# Patient Record
Sex: Female | Born: 1970 | Race: White | Hispanic: No | Marital: Married | Smoking: Never smoker
Health system: Southern US, Community
[De-identification: ages and names within clinical notes are randomized; demographics above are authoritative.]

## PROBLEM LIST (undated history)

## (undated) DIAGNOSIS — G40909 Epilepsy, unspecified, not intractable, without status epilepticus: Secondary | ICD-10-CM

## (undated) DIAGNOSIS — D649 Anemia, unspecified: Secondary | ICD-10-CM

## (undated) DIAGNOSIS — G40209 Localization-related (focal) (partial) symptomatic epilepsy and epileptic syndromes with complex partial seizures, not intractable, without status epilepticus: Secondary | ICD-10-CM

## (undated) DIAGNOSIS — E871 Hypo-osmolality and hyponatremia: Secondary | ICD-10-CM

## (undated) DIAGNOSIS — J329 Chronic sinusitis, unspecified: Secondary | ICD-10-CM

## (undated) DIAGNOSIS — R87612 Low grade squamous intraepithelial lesion on cytologic smear of cervix (LGSIL): Secondary | ICD-10-CM

## (undated) DIAGNOSIS — K219 Gastro-esophageal reflux disease without esophagitis: Secondary | ICD-10-CM

## (undated) DIAGNOSIS — G43909 Migraine, unspecified, not intractable, without status migrainosus: Secondary | ICD-10-CM

## (undated) DIAGNOSIS — T7840XA Allergy, unspecified, initial encounter: Secondary | ICD-10-CM

## (undated) DIAGNOSIS — T8859XA Other complications of anesthesia, initial encounter: Secondary | ICD-10-CM

## (undated) HISTORY — DX: Epilepsy, unspecified, not intractable, without status epilepticus: G40.909

## (undated) HISTORY — DX: Low grade squamous intraepithelial lesion on cytologic smear of cervix (LGSIL): R87.612

## (undated) HISTORY — PX: NASAL SINUS SURGERY: SHX719

## (undated) HISTORY — PX: TUBAL LIGATION: SHX77

## (undated) HISTORY — DX: Chronic sinusitis, unspecified: J32.9

## (undated) HISTORY — DX: Migraine, unspecified, not intractable, without status migrainosus: G43.909

## (undated) HISTORY — PX: WISDOM TOOTH EXTRACTION: SHX21

---

## 1998-01-22 ENCOUNTER — Other Ambulatory Visit: Admission: RE | Admit: 1998-01-22 | Discharge: 1998-01-22 | Payer: Self-pay | Admitting: Gynecology

## 2000-01-14 ENCOUNTER — Other Ambulatory Visit: Admission: RE | Admit: 2000-01-14 | Discharge: 2000-01-14 | Payer: Self-pay | Admitting: Gynecology

## 2000-05-17 ENCOUNTER — Encounter: Admission: RE | Admit: 2000-05-17 | Discharge: 2000-08-15 | Payer: Self-pay | Admitting: Gynecology

## 2000-06-25 ENCOUNTER — Inpatient Hospital Stay (HOSPITAL_COMMUNITY): Admission: AD | Admit: 2000-06-25 | Discharge: 2000-06-25 | Payer: Self-pay | Admitting: Gynecology

## 2000-06-25 ENCOUNTER — Encounter: Payer: Self-pay | Admitting: Gynecology

## 2000-06-27 ENCOUNTER — Inpatient Hospital Stay (HOSPITAL_COMMUNITY): Admission: AD | Admit: 2000-06-27 | Discharge: 2000-06-27 | Payer: Self-pay | Admitting: Gynecology

## 2000-07-24 ENCOUNTER — Inpatient Hospital Stay (HOSPITAL_COMMUNITY): Admission: AD | Admit: 2000-07-24 | Discharge: 2000-07-27 | Payer: Self-pay | Admitting: Gynecology

## 2000-07-24 ENCOUNTER — Encounter (INDEPENDENT_AMBULATORY_CARE_PROVIDER_SITE_OTHER): Payer: Self-pay

## 2000-08-29 ENCOUNTER — Other Ambulatory Visit: Admission: RE | Admit: 2000-08-29 | Discharge: 2000-08-29 | Payer: Self-pay | Admitting: Gynecology

## 2001-09-18 ENCOUNTER — Other Ambulatory Visit: Admission: RE | Admit: 2001-09-18 | Discharge: 2001-09-18 | Payer: Self-pay | Admitting: Gynecology

## 2002-10-22 ENCOUNTER — Other Ambulatory Visit: Admission: RE | Admit: 2002-10-22 | Discharge: 2002-10-22 | Payer: Self-pay | Admitting: Gynecology

## 2003-10-28 ENCOUNTER — Other Ambulatory Visit: Admission: RE | Admit: 2003-10-28 | Discharge: 2003-10-28 | Payer: Self-pay | Admitting: Gynecology

## 2004-11-03 ENCOUNTER — Other Ambulatory Visit: Admission: RE | Admit: 2004-11-03 | Discharge: 2004-11-03 | Payer: Self-pay | Admitting: Gynecology

## 2005-11-27 ENCOUNTER — Emergency Department (HOSPITAL_COMMUNITY): Admission: EM | Admit: 2005-11-27 | Discharge: 2005-11-27 | Payer: Self-pay | Admitting: Emergency Medicine

## 2005-12-16 ENCOUNTER — Ambulatory Visit (HOSPITAL_COMMUNITY): Admission: RE | Admit: 2005-12-16 | Discharge: 2005-12-16 | Payer: Self-pay | Admitting: Gynecology

## 2006-01-05 ENCOUNTER — Other Ambulatory Visit: Admission: RE | Admit: 2006-01-05 | Discharge: 2006-01-05 | Payer: Self-pay | Admitting: Gynecology

## 2007-01-23 ENCOUNTER — Other Ambulatory Visit: Admission: RE | Admit: 2007-01-23 | Discharge: 2007-01-23 | Payer: Self-pay | Admitting: Gynecology

## 2007-03-25 ENCOUNTER — Emergency Department: Payer: Self-pay | Admitting: Emergency Medicine

## 2008-12-05 ENCOUNTER — Other Ambulatory Visit: Admission: RE | Admit: 2008-12-05 | Discharge: 2008-12-05 | Payer: Self-pay | Admitting: Gynecology

## 2008-12-05 ENCOUNTER — Ambulatory Visit: Payer: Self-pay | Admitting: Gynecology

## 2008-12-05 ENCOUNTER — Encounter: Payer: Self-pay | Admitting: Gynecology

## 2009-12-11 ENCOUNTER — Ambulatory Visit (HOSPITAL_COMMUNITY): Admission: RE | Admit: 2009-12-11 | Discharge: 2009-12-11 | Payer: Self-pay | Admitting: Gynecology

## 2009-12-16 ENCOUNTER — Other Ambulatory Visit: Admission: RE | Admit: 2009-12-16 | Discharge: 2009-12-16 | Payer: Self-pay | Admitting: Gynecology

## 2009-12-16 ENCOUNTER — Ambulatory Visit: Payer: Self-pay | Admitting: Gynecology

## 2010-03-23 ENCOUNTER — Ambulatory Visit: Payer: Self-pay | Admitting: Unknown Physician Specialty

## 2010-04-09 ENCOUNTER — Ambulatory Visit: Payer: Self-pay | Admitting: Unknown Physician Specialty

## 2010-11-05 NOTE — Op Note (Signed)
[facility]  Patient:    Patricia Cooper, Patricia Cooper Visit Number: [id number] MRN: [id number]          Service Type: OBS Location: 910A [id number] [id number] Attending Physician:  Douglass Rivers Proc. Date: 07/26/00 Admit Date:  07/24/2000 Discharge Date: 07/27/2000                             Operative Report  PREOPERATIVE DIAGNOSIS:       Desires permanent sterilization.  POSTOPERATIVE DIAGNOSIS:      Desires permanent sterilization.  PROCEDURE:                    Postpartum bilateral tubal sterilization.  SURGEON:                      Timothy P. Fontaine, M.D.  ANESTHESIA:                   Epidural, local skin.  SPECIMENS:                    Portions of right and left fallopian tube.  ESTIMATED BLOOD LOSS:         Minimal.  COMPLICATIONS:                None.  FINDINGS:                     The right and left fallopian tubes normal in caliber with fimbriated ends.  The right and left ovaries were grossly normal to limited inspection.  DESCRIPTION OF PROCEDURE:     The patient was taken to the operating room, had her epidural catheter dosed, received a periumbilical preparation with Betadine solution and was draped in the usual fashion.  Due to persistent skin sensitivity, the patient received 0.25% Marcaine skin infiltration in the periumbilical site.  Subsequently, the abdomen was sharply entered through a small infraumbilical transverse incision.  The left fallopian tube was then identified, delivered through the incision, traced from its insertion to its fimbriated end and a mid tubal segment was then doubly ligated using 0 plain suture and sharply excised.  The tubal lumen as well as adequate hemostasis was grossly visualized afterwards.  A similar procedure was then carried out on the other side.  The anterior fascia was then reapproximated with 0 Vicryl suture in a running stitch.  The subcutaneous tissues were reapproximated with several interrupted 0  Vicryl sutures and the skin was reapproximated using 4-0 Vicryl suture on a running subcuticular stitch.  A sterile dressing was applied.  The patient was taken to the recovery room in good condition, having tolerated the procedure well. Attending Physician:  Douglass Rivers DD:  07/26/00 TD:  07/27/00 Job: [id number]

## 2010-11-05 NOTE — Discharge Summary (Signed)
[facility]  Patient:    Patricia Cooper, Patricia Cooper                        MRN: [id number] Adm. Date:  [id number] Disc. Date: [id number] Attending:  Douglass Rivers Dictator:   Antony Contras, N.P.                           Discharge Summary  DISCHARGE DIAGNOSES:          Intrauterine pregnancy at term, history of seizure disorder.  PROCEDURE:                    Mityvac assisted vaginal delivery over an intact perineum with repair of a second degree midline laceration and small supraurethral lacerations, postpartum tubal sterilization.  HISTORY OF PRESENT ILLNESS:   Patient is a 40 year old gravida 2, para 1-0-0-1 with LMP of May 2001, [facility] August 03, 2000.  Prenatal risk factors include history of seizure disorder.  Patient is on Tegretol 500 mg b.i.d.  PRENATAL LABORATORIES:        Blood type A+.  Antibody screen negative.  RPR, HBSAG, HIV, toxo negative.  Rubella immune.  GBS negative.  HOSPITAL COURSE:              Patient was admitted on July 25, 2000 with spontaneous onset of labor.  Cervix was 2-3 cm, posterior.  She did progress to complete dilatation.  Delivery was accomplished per vacuum assistance.  She was delivered of an Apgar 8/9 female infant weighing 7 pounds 8 ounces over an intact perineum.  She did sustain second degree midline laceration and some small supraurethral lacerations which were repaired.  Placenta was intact. She wished sterilization and therefore postpartum tubal sterilization, modified Pomeroy technique, was performed by Dr. Audie Box on July 26, 2000 under epidural anesthesia.  Postpartum and postoperative courses were uncomplicated.  She remained afebrile.  She was able to be discharged on her second postpartum/first postoperative day in satisfactory condition.  LABORATORIES:                 CBC:  Hematocrit 30.4, hemoglobin 10.6, WBC 11.8, platelets 185,000.  DISPOSITION:                  Follow-up in six weeks.   Continue with Motrin for pain.  Continue prenatal vitamins and iron. DD:  08/28/00 TD:  08/29/00 Job: [id number]

## 2010-12-03 ENCOUNTER — Other Ambulatory Visit: Payer: Self-pay | Admitting: Gynecology

## 2010-12-03 DIAGNOSIS — Z1231 Encounter for screening mammogram for malignant neoplasm of breast: Secondary | ICD-10-CM

## 2010-12-15 ENCOUNTER — Ambulatory Visit (HOSPITAL_COMMUNITY)
Admission: RE | Admit: 2010-12-15 | Discharge: 2010-12-15 | Disposition: A | Discharge status: Home / Self Care | Payer: BC Managed Care – PPO | Source: Ambulatory Visit | Attending: Gynecology | Admitting: Gynecology

## 2010-12-15 DIAGNOSIS — Z1231 Encounter for screening mammogram for malignant neoplasm of breast: Secondary | ICD-10-CM

## 2010-12-16 ENCOUNTER — Other Ambulatory Visit: Payer: Self-pay | Admitting: Gynecology

## 2010-12-16 DIAGNOSIS — R928 Other abnormal and inconclusive findings on diagnostic imaging of breast: Secondary | ICD-10-CM

## 2010-12-20 ENCOUNTER — Ambulatory Visit
Admission: RE | Admit: 2010-12-20 | Discharge: 2010-12-20 | Disposition: A | Discharge status: Home / Self Care | Payer: BC Managed Care – PPO | Source: Ambulatory Visit | Attending: Gynecology | Admitting: Gynecology

## 2010-12-20 DIAGNOSIS — R928 Other abnormal and inconclusive findings on diagnostic imaging of breast: Secondary | ICD-10-CM

## 2010-12-28 ENCOUNTER — Encounter (INDEPENDENT_AMBULATORY_CARE_PROVIDER_SITE_OTHER): Payer: BC Managed Care – PPO | Admitting: Gynecology

## 2010-12-28 ENCOUNTER — Other Ambulatory Visit (HOSPITAL_COMMUNITY)
Admission: RE | Admit: 2010-12-28 | Discharge: 2010-12-28 | Disposition: A | Discharge status: Home / Self Care | Payer: BC Managed Care – PPO | Source: Ambulatory Visit | Attending: Gynecology | Admitting: Gynecology

## 2010-12-28 ENCOUNTER — Other Ambulatory Visit: Payer: Self-pay | Admitting: Gynecology

## 2010-12-28 DIAGNOSIS — R823 Hemoglobinuria: Secondary | ICD-10-CM

## 2010-12-28 DIAGNOSIS — Z13220 Encounter for screening for lipoid disorders: Secondary | ICD-10-CM

## 2010-12-28 DIAGNOSIS — Z01419 Encounter for gynecological examination (general) (routine) without abnormal findings: Secondary | ICD-10-CM

## 2010-12-28 DIAGNOSIS — Z124 Encounter for screening for malignant neoplasm of cervix: Secondary | ICD-10-CM | POA: Insufficient documentation

## 2010-12-28 DIAGNOSIS — Z833 Family history of diabetes mellitus: Secondary | ICD-10-CM

## 2011-12-14 ENCOUNTER — Other Ambulatory Visit: Payer: Self-pay | Admitting: Gynecology

## 2011-12-14 DIAGNOSIS — Z1231 Encounter for screening mammogram for malignant neoplasm of breast: Secondary | ICD-10-CM

## 2012-01-06 ENCOUNTER — Ambulatory Visit (HOSPITAL_COMMUNITY)
Admission: RE | Admit: 2012-01-06 | Discharge: 2012-01-06 | Disposition: A | Discharge status: Home / Self Care | Payer: BC Managed Care – PPO | Source: Ambulatory Visit | Attending: Gynecology | Admitting: Gynecology

## 2012-01-06 DIAGNOSIS — Z1231 Encounter for screening mammogram for malignant neoplasm of breast: Secondary | ICD-10-CM | POA: Insufficient documentation

## 2012-04-06 ENCOUNTER — Encounter: Payer: BC Managed Care – PPO | Admitting: Gynecology

## 2012-10-08 ENCOUNTER — Encounter: Payer: Self-pay | Admitting: Gynecology

## 2012-10-08 ENCOUNTER — Ambulatory Visit (INDEPENDENT_AMBULATORY_CARE_PROVIDER_SITE_OTHER): Payer: BC Managed Care – PPO | Admitting: Gynecology

## 2012-10-08 VITALS — BP 114/70 | Ht 65.0 in | Wt 146.0 lb

## 2012-10-08 DIAGNOSIS — Z13220 Encounter for screening for lipoid disorders: Secondary | ICD-10-CM

## 2012-10-08 DIAGNOSIS — Z01419 Encounter for gynecological examination (general) (routine) without abnormal findings: Secondary | ICD-10-CM

## 2012-10-08 LAB — CBC WITH DIFFERENTIAL/PLATELET
Basophils Absolute: 0 10*3/uL (ref 0.0–0.1)
Basophils Relative: 1 % (ref 0–1)
Eosinophils Relative: 6 % — ABNORMAL HIGH (ref 0–5)
HCT: 36.6 % (ref 36.0–46.0)
Hemoglobin: 12.2 g/dL (ref 12.0–15.0)
MCH: 30.3 pg (ref 26.0–34.0)
MCHC: 33.3 g/dL (ref 30.0–36.0)
MCV: 91 fL (ref 78.0–100.0)
Monocytes Absolute: 0.5 10*3/uL (ref 0.1–1.0)
Monocytes Relative: 13 % — ABNORMAL HIGH (ref 3–12)
RDW: 13.6 % (ref 11.5–15.5)

## 2012-10-08 LAB — COMPREHENSIVE METABOLIC PANEL
AST: 18 U/L (ref 0–37)
Albumin: 4.3 g/dL (ref 3.5–5.2)
Alkaline Phosphatase: 58 U/L (ref 39–117)
BUN: 8 mg/dL (ref 6–23)
Calcium: 9.2 mg/dL (ref 8.4–10.5)
Creat: 0.6 mg/dL (ref 0.50–1.10)
Glucose, Bld: 87 mg/dL (ref 70–99)
Potassium: 4.1 mEq/L (ref 3.5–5.3)

## 2012-10-08 LAB — LIPID PANEL
HDL: 76 mg/dL (ref >39)
LDL Cholesterol: 137 mg/dL — ABNORMAL HIGH (ref 0–99)
Total CHOL/HDL Ratio: 3 Ratio
Triglycerides: 60 mg/dL (ref <150)

## 2012-10-08 NOTE — Progress Notes (Signed)
Patricia Cooper February 19, 1971 [id number]        42 y.o.  G2P2002 for annual exam.  Doing well without complaints.  Past medical history,surgical history, medications, allergies, family history and social history were all reviewed and documented in the EPIC chart. ROS:  Was performed and pertinent positives and negatives are included in the history.  Exam: Kim assistant Filed Vitals:   10/08/12 1007  BP: 114/70  Height: 5\' 5"  (1.651 m)  Weight: 146 lb (66.225 kg)   General appearance  Normal Skin grossly normal Head/Neck normal with no cervical or supraclavicular adenopathy thyroid normal Lungs  clear Cardiac RR, without RMG Abdominal  soft, nontender, without masses, organomegaly or hernia Breasts  examined lying and sitting without masses, retractions, discharge or axillary adenopathy. Pelvic  Ext/BUS/vagina  normal   Cervix  normal   Uterus  anteverted, normal size, shape and contour, midline and mobile nontender   Adnexa  Without masses or tenderness    Anus and perineum  normal   Rectovaginal  normal sphincter tone without palpated masses or tenderness.    Assessment/Plan:  42 y.o. G59P2002 female for annual exam, regular menses, tubal sterilization.   1. Pap smear 12/2010. No Pap smear done today. No history of abnormal Pap smears. Plan repeat next year at 3 year interval. 2. Mammography 12/2011. Continue with annual mammography. SBE monthly reviewed. 3. Health maintenance. Baseline CBC comprehensive metabolic panel lipid profile urinalysis ordered. Continue to followup with her other physicians in reference to her asthma/epilepsy. Followup one year for annual pathologic exam.    Dara Lords MD, 10:34 AM 10/08/2012

## 2012-10-08 NOTE — Patient Instructions (Signed)
Follow up in one year for annual gynecologic exam. 

## 2012-10-09 ENCOUNTER — Other Ambulatory Visit: Payer: Self-pay | Admitting: Gynecology

## 2012-10-09 DIAGNOSIS — D72819 Decreased white blood cell count, unspecified: Secondary | ICD-10-CM

## 2012-10-09 DIAGNOSIS — E871 Hypo-osmolality and hyponatremia: Secondary | ICD-10-CM

## 2012-10-09 DIAGNOSIS — E7800 Pure hypercholesterolemia, unspecified: Secondary | ICD-10-CM

## 2012-10-09 DIAGNOSIS — E878 Other disorders of electrolyte and fluid balance, not elsewhere classified: Secondary | ICD-10-CM

## 2012-10-09 LAB — URINALYSIS W MICROSCOPIC + REFLEX CULTURE
Bacteria, UA: NONE SEEN
Casts: NONE SEEN
Glucose, UA: NEGATIVE mg/dL
Hgb urine dipstick: NEGATIVE
Ketones, ur: NEGATIVE mg/dL
Leukocytes, UA: NEGATIVE
Protein, ur: NEGATIVE mg/dL
pH: 6.5 (ref 5.0–8.0)

## 2012-10-10 ENCOUNTER — Other Ambulatory Visit: Payer: BC Managed Care – PPO

## 2012-10-10 DIAGNOSIS — E871 Hypo-osmolality and hyponatremia: Secondary | ICD-10-CM

## 2012-10-10 DIAGNOSIS — E878 Other disorders of electrolyte and fluid balance, not elsewhere classified: Secondary | ICD-10-CM

## 2012-10-10 DIAGNOSIS — D72819 Decreased white blood cell count, unspecified: Secondary | ICD-10-CM

## 2012-10-10 DIAGNOSIS — E7800 Pure hypercholesterolemia, unspecified: Secondary | ICD-10-CM

## 2012-10-10 LAB — ELECTROLYTE PANEL
Chloride: 98 mEq/L (ref 96–112)
Potassium: 4.2 mEq/L (ref 3.5–5.3)

## 2012-10-10 LAB — CBC WITH DIFFERENTIAL/PLATELET
Basophils Absolute: 0 K/uL (ref 0.0–0.1)
Basophils Relative: 1 % (ref 0–1)
Eosinophils Absolute: 0.2 K/uL (ref 0.0–0.7)
Eosinophils Relative: 6 % — ABNORMAL HIGH (ref 0–5)
HCT: 35.9 % — ABNORMAL LOW (ref 36.0–46.0)
Hemoglobin: 12.1 g/dL (ref 12.0–15.0)
Lymphocytes Relative: 36 % (ref 12–46)
Lymphs Abs: 1.2 K/uL (ref 0.7–4.0)
MCH: 30.3 pg (ref 26.0–34.0)
MCHC: 33.7 g/dL (ref 30.0–36.0)
MCV: 90 fL (ref 78.0–100.0)
Monocytes Absolute: 0.4 K/uL (ref 0.1–1.0)
Monocytes Relative: 11 % (ref 3–12)
Neutro Abs: 1.5 K/uL — ABNORMAL LOW (ref 1.7–7.7)
Neutrophils Relative %: 46 % (ref 43–77)
Platelets: 315 K/uL (ref 150–400)
RBC: 3.99 MIL/uL (ref 3.87–5.11)
RDW: 13.5 % (ref 11.5–15.5)
WBC: 3.3 K/uL — ABNORMAL LOW (ref 4.0–10.5)

## 2012-10-10 LAB — LIPID PANEL: LDL Cholesterol: 137 mg/dL — ABNORMAL HIGH (ref 0–99)

## 2012-10-16 ENCOUNTER — Telehealth: Payer: Self-pay

## 2012-10-16 NOTE — Telephone Encounter (Signed)
Patient called to say she took Provera and no menses within a week of last pill.  I told her Dr. Glenetta Hew had recommended office visit if no menses.  She was transferred to appt desk to schedule appt.

## 2013-04-25 ENCOUNTER — Other Ambulatory Visit: Payer: Self-pay

## 2013-12-13 ENCOUNTER — Other Ambulatory Visit: Payer: Self-pay

## 2013-12-13 DIAGNOSIS — Z1231 Encounter for screening mammogram for malignant neoplasm of breast: Secondary | ICD-10-CM

## 2014-01-01 ENCOUNTER — Ambulatory Visit
Admission: RE | Admit: 2014-01-01 | Discharge: 2014-01-01 | Disposition: A | Discharge status: Home / Self Care | Payer: BC Managed Care – PPO | Source: Ambulatory Visit

## 2014-01-01 DIAGNOSIS — Z1231 Encounter for screening mammogram for malignant neoplasm of breast: Secondary | ICD-10-CM

## 2014-01-24 ENCOUNTER — Other Ambulatory Visit (HOSPITAL_COMMUNITY)
Admission: RE | Admit: 2014-01-24 | Discharge: 2014-01-24 | Disposition: A | Discharge status: Home / Self Care | Payer: BC Managed Care – PPO | Source: Ambulatory Visit | Attending: Gynecology | Admitting: Gynecology

## 2014-01-24 ENCOUNTER — Encounter: Payer: Self-pay | Admitting: Gynecology

## 2014-01-24 ENCOUNTER — Ambulatory Visit (INDEPENDENT_AMBULATORY_CARE_PROVIDER_SITE_OTHER): Payer: BC Managed Care – PPO | Admitting: Gynecology

## 2014-01-24 VITALS — BP 112/66 | Ht 65.0 in | Wt 166.0 lb

## 2014-01-24 DIAGNOSIS — Z1151 Encounter for screening for human papillomavirus (HPV): Secondary | ICD-10-CM | POA: Diagnosis present

## 2014-01-24 DIAGNOSIS — Z01419 Encounter for gynecological examination (general) (routine) without abnormal findings: Secondary | ICD-10-CM

## 2014-01-24 DIAGNOSIS — J45909 Unspecified asthma, uncomplicated: Secondary | ICD-10-CM | POA: Insufficient documentation

## 2014-01-24 DIAGNOSIS — Z124 Encounter for screening for malignant neoplasm of cervix: Secondary | ICD-10-CM | POA: Diagnosis present

## 2014-01-24 DIAGNOSIS — G40909 Epilepsy, unspecified, not intractable, without status epilepticus: Secondary | ICD-10-CM | POA: Insufficient documentation

## 2014-01-24 LAB — CBC WITH DIFFERENTIAL/PLATELET
BASOS PCT: 1 % (ref 0–1)
Basophils Absolute: 0 10*3/uL (ref 0.0–0.1)
EOS ABS: 0.2 10*3/uL (ref 0.0–0.7)
EOS PCT: 6 % — AB (ref 0–5)
HCT: 32.7 % — ABNORMAL LOW (ref 36.0–46.0)
Hemoglobin: 10.7 g/dL — ABNORMAL LOW (ref 12.0–15.0)
Lymphocytes Relative: 33 % (ref 12–46)
Lymphs Abs: 1 10*3/uL (ref 0.7–4.0)
MCH: 27.8 pg (ref 26.0–34.0)
MCHC: 32.7 g/dL (ref 30.0–36.0)
MCV: 84.9 fL (ref 78.0–100.0)
Monocytes Absolute: 0.4 10*3/uL (ref 0.1–1.0)
Monocytes Relative: 12 % (ref 3–12)
NEUTROS PCT: 48 % (ref 43–77)
Neutro Abs: 1.4 10*3/uL — ABNORMAL LOW (ref 1.7–7.7)
PLATELETS: 358 10*3/uL (ref 150–400)
RBC: 3.85 MIL/uL — ABNORMAL LOW (ref 3.87–5.11)
RDW: 14.3 % (ref 11.5–15.5)
WBC: 3 10*3/uL — ABNORMAL LOW (ref 4.0–10.5)

## 2014-01-24 LAB — LIPID PANEL
CHOLESTEROL: 225 mg/dL — AB (ref 0–200)
HDL: 79 mg/dL (ref >39)
LDL Cholesterol: 133 mg/dL — ABNORMAL HIGH (ref 0–99)
TRIGLYCERIDES: 67 mg/dL (ref <150)
Total CHOL/HDL Ratio: 2.8 Ratio
VLDL: 13 mg/dL (ref 0–40)

## 2014-01-24 LAB — COMPREHENSIVE METABOLIC PANEL
ALK PHOS: 64 U/L (ref 39–117)
ALT: 8 U/L (ref 0–35)
AST: 13 U/L (ref 0–37)
Albumin: 4 g/dL (ref 3.5–5.2)
BILIRUBIN TOTAL: 0.4 mg/dL (ref 0.2–1.2)
BUN: 9 mg/dL (ref 6–23)
CO2: 27 meq/L (ref 19–32)
CREATININE: 0.7 mg/dL (ref 0.50–1.10)
Calcium: 8.8 mg/dL (ref 8.4–10.5)
Chloride: 101 mEq/L (ref 96–112)
GLUCOSE: 86 mg/dL (ref 70–99)
Potassium: 4.5 mEq/L (ref 3.5–5.3)
SODIUM: 134 meq/L — AB (ref 135–145)
TOTAL PROTEIN: 6.4 g/dL (ref 6.0–8.3)

## 2014-01-24 NOTE — Patient Instructions (Signed)
Followup for the LEEP appointment where we will take a closer look at your cervix.  You may obtain a copy of any labs that were done today by logging onto MyChart as outlined in the instructions provided with your AVS (after visit summary). The office will not call with normal lab results but certainly if there are any significant abnormalities then we will contact you.   Health Maintenance, Female A healthy lifestyle and preventative care can promote health and wellness.  Maintain regular health, dental, and eye exams.  Eat a healthy diet. Foods like vegetables, fruits, whole grains, low-fat dairy products, and lean protein foods contain the nutrients you need without too many calories. Decrease your intake of foods high in solid fats, added sugars, and salt. Get information about a proper diet from your caregiver, if necessary.  Regular physical exercise is one of the most important things you can do for your health. Most adults should get at least 150 minutes of moderate-intensity exercise (any activity that increases your heart rate and causes you to sweat) each week. In addition, most adults need muscle-strengthening exercises on 2 or more days a week.   Maintain a healthy weight. The body mass index (BMI) is a screening tool to identify possible weight problems. It provides an estimate of body fat based on height and weight. Your caregiver can help determine your BMI, and can help you achieve or maintain a healthy weight. For adults 20 years and older:  A BMI below 18.5 is considered underweight.  A BMI of 18.5 to 24.9 is normal.  A BMI of 25 to 29.9 is considered overweight.  A BMI of 30 and above is considered obese.  Maintain normal blood lipids and cholesterol by exercising and minimizing your intake of saturated fat. Eat a balanced diet with plenty of fruits and vegetables. Blood tests for lipids and cholesterol should begin at age 70 and be repeated every 5 years. If your lipid or  cholesterol levels are high, you are over 50, or you are a high risk for heart disease, you may need your cholesterol levels checked more frequently.Ongoing high lipid and cholesterol levels should be treated with medicines if diet and exercise are not effective.  If you smoke, find out from your caregiver how to quit. If you do not use tobacco, do not start.  Lung cancer screening is recommended for adults aged 64 80 years who are at high risk for developing lung cancer because of a history of smoking. Yearly low-dose computed tomography (CT) is recommended for people who have at least a 30-pack-year history of smoking and are a current smoker or have quit within the past 15 years. A pack year of smoking is smoking an average of 1 pack of cigarettes a day for 1 year (for example: 1 pack a day for 30 years or 2 packs a day for 15 years). Yearly screening should continue until the smoker has stopped smoking for at least 15 years. Yearly screening should also be stopped for people who develop a health problem that would prevent them from having lung cancer treatment.  If you are pregnant, do not drink alcohol. If you are breastfeeding, be very cautious about drinking alcohol. If you are not pregnant and choose to drink alcohol, do not exceed 1 drink per day. One drink is considered to be 12 ounces (355 mL) of beer, 5 ounces (148 mL) of wine, or 1.5 ounces (44 mL) of liquor.  Avoid use of street drugs. Do  not share needles with anyone. Ask for help if you need support or instructions about stopping the use of drugs.  High blood pressure causes heart disease and increases the risk of stroke. Blood pressure should be checked at least every 1 to 2 years. Ongoing high blood pressure should be treated with medicines, if weight loss and exercise are not effective.  If you are 42 to 45 years old, ask your caregiver if you should take aspirin to prevent strokes.  Diabetes screening involves taking a blood sample  to check your fasting blood sugar level. This should be done once every 3 years, after age 68, if you are within normal weight and without risk factors for diabetes. Testing should be considered at a younger age or be carried out more frequently if you are overweight and have at least 1 risk factor for diabetes.  Breast cancer screening is essential preventative care for women. You should practice "breast self-awareness." This means understanding the normal appearance and feel of your breasts and may include breast self-examination. Any changes detected, no matter how small, should be reported to a caregiver. Women in their 70s and 30s should have a clinical breast exam (CBE) by a caregiver as part of a regular health exam every 1 to 3 years. After age 84, women should have a CBE every year. Starting at age 66, women should consider having a mammogram (breast X-ray) every year. Women who have a family history of breast cancer should talk to their caregiver about genetic screening. Women at a high risk of breast cancer should talk to their caregiver about having an MRI and a mammogram every year.  Breast cancer gene (BRCA)-related cancer risk assessment is recommended for women who have family members with BRCA-related cancers. BRCA-related cancers include breast, ovarian, tubal, and peritoneal cancers. Having family members with these cancers may be associated with an increased risk for harmful changes (mutations) in the breast cancer genes BRCA1 and BRCA2. Results of the assessment will determine the need for genetic counseling and BRCA1 and BRCA2 testing.  The Pap test is a screening test for cervical cancer. Women should have a Pap test starting at age 40. Between ages 72 and 14, Pap tests should be repeated every 2 years. Beginning at age 22, you should have a Pap test every 3 years as long as the past 3 Pap tests have been normal. If you had a hysterectomy for a problem that was not cancer or a condition  that could lead to cancer, then you no longer need Pap tests. If you are between ages 47 and 66, and you have had normal Pap tests going back 10 years, you no longer need Pap tests. If you have had past treatment for cervical cancer or a condition that could lead to cancer, you need Pap tests and screening for cancer for at least 20 years after your treatment. If Pap tests have been discontinued, risk factors (such as a new sexual partner) need to be reassessed to determine if screening should be resumed. Some women have medical problems that increase the chance of getting cervical cancer. In these cases, your caregiver may recommend more frequent screening and Pap tests.  The human papillomavirus (HPV) test is an additional test that may be used for cervical cancer screening. The HPV test looks for the virus that can cause the cell changes on the cervix. The cells collected during the Pap test can be tested for HPV. The HPV test could be used to  screen women aged 10 years and older, and should be used in women of any age who have unclear Pap test results. After the age of 3, women should have HPV testing at the same frequency as a Pap test.  Colorectal cancer can be detected and often prevented. Most routine colorectal cancer screening begins at the age of 18 and continues through age 37. However, your caregiver may recommend screening at an earlier age if you have risk factors for colon cancer. On a yearly basis, your caregiver may provide home test kits to check for hidden blood in the stool. Use of a small camera at the end of a tube, to directly examine the colon (sigmoidoscopy or colonoscopy), can detect the earliest forms of colorectal cancer. Talk to your caregiver about this at age 51, when routine screening begins. Direct examination of the colon should be repeated every 5 to 10 years through age 15, unless early forms of pre-cancerous polyps or small growths are found.  Hepatitis C blood testing is  recommended for all people born from [birth year] through 1967 and any individual with known risks for hepatitis C.  Practice safe sex. Use condoms and avoid high-risk sexual practices to reduce the spread of sexually transmitted infections (STIs). Sexually active women aged 61 and younger should be checked for Chlamydia, which is a common sexually transmitted infection. Older women with new or multiple partners should also be tested for Chlamydia. Testing for other STIs is recommended if you are sexually active and at increased risk.  Osteoporosis is a disease in which the bones lose minerals and strength with aging. This can result in serious bone fractures. The risk of osteoporosis can be identified using a bone density scan. Women ages 58 and over and women at risk for fractures or osteoporosis should discuss screening with their caregivers. Ask your caregiver whether you should be taking a calcium supplement or vitamin D to reduce the rate of osteoporosis.  Menopause can be associated with physical symptoms and risks. Hormone replacement therapy is available to decrease symptoms and risks. You should talk to your caregiver about whether hormone replacement therapy is right for you.  Use sunscreen. Apply sunscreen liberally and repeatedly throughout the day. You should seek shade when your shadow is shorter than you. Protect yourself by wearing long sleeves, pants, a wide-brimmed hat, and sunglasses year round, whenever you are outdoors.  Notify your caregiver of new moles or changes in moles, especially if there is a change in shape or color. Also notify your caregiver if a mole is larger than the size of a pencil eraser.  Stay current with your immunizations. Document Released: 12/20/2010 Document Revised: 10/01/2012 Document Reviewed: 12/20/2010 [facility] Patient Information 2014 [place].

## 2014-01-24 NOTE — Addendum Note (Signed)
Addended by: Dayna BarkerGARDNER, Rip Hawes K on: 01/24/2014 09:29 AM   Modules accepted: Orders

## 2014-01-24 NOTE — Progress Notes (Signed)
Patient ID: Patricia CoddingtonKristine F Cooper, female   DOB: 10/30/1970, 43 y.o.   MRN: [id number] Patricia CoddingtonKristine F Cooper 03/15/1971 [id number]        43 y.o.  [id number] for annual exam.  Several issues noted below.   Past medical history,surgical history, problem list, medications, allergies, family history and social history were all reviewed and documented as reviewed in the EPIC chart.  ROS:  12 system ROS performed with pertinent positives and negatives included in the history, assessment and plan.   Additional significant findings :  None   Exam: Kim assistant Filed Vitals:   01/24/14 0858  BP: 112/66  Height: 5\' 5"  (1.651 m)  Weight: 166 lb (75.297 kg)   General appearance:  Normal affect, orientation and appearance. Skin: Grossly normal HEENT: Without gross lesions.  No cervical or supraclavicular adenopathy. Thyroid normal.  Lungs:  Clear without wheezing, rales or rhonchi Cardiac: RR, without RMG Abdominal:  Soft, nontender, without masses, guarding, rebound, organomegaly or hernia Breasts:  Examined lying and sitting without masses, retractions, discharge or axillary adenopathy. Pelvic:  Ext/BUS/vagina normal  Cervix with bulbous area anterior lip of the transformation zone. Questionable large nabothian cyst or normal ectropion tissue. Pap/HPV done. Physical Exam  Genitourinary:       Uterus anteverted, normal size, shape and contour, midline and mobile nontender   Adnexa  Without masses or tenderness    Anus and perineum  Normal   Rectovaginal  Normal sphincter tone without palpated masses or tenderness.    Assessment/Plan:  43 y.o. 12P2002 female for annual exam with regular menses, tubal sterilization.   1. Bulbous area on her cervix not appreciated previously. Recommend colposcopy for closer look and possible excision/biopsy with LEEP. Patient will make an appointment and followup for this. 2. Mammography 12/2013. Continue with annual mammography. SBE monthly reviewed. 3. Pap smear 2012.  Pap/HPV done today. 4. Health maintenance. Baseline CBC, comprehensive metabolic panel, lipid profile, urinalysis done. Followup for LEEP appointment, otherwise annually   Note: This document was prepared with digital dictation and possible smart phrase technology. Any transcriptional errors that result from this process are unintentional.   Dara LordsFONTAINE,Tamon Parkerson P MD, 9:19 AM 01/24/2014

## 2014-01-25 LAB — URINALYSIS W MICROSCOPIC + REFLEX CULTURE
BILIRUBIN URINE: NEGATIVE
CASTS: NONE SEEN
CRYSTALS: NONE SEEN
Glucose, UA: NEGATIVE mg/dL
Hgb urine dipstick: NEGATIVE
KETONES UR: NEGATIVE mg/dL
Leukocytes, UA: NEGATIVE
Nitrite: NEGATIVE
PH: 6.5 (ref 5.0–8.0)
Protein, ur: NEGATIVE mg/dL
Specific Gravity, Urine: 1.017 (ref 1.005–1.030)
Urobilinogen, UA: 0.2 mg/dL (ref 0.0–1.0)

## 2014-01-26 LAB — URINE CULTURE: Colony Count: 8000

## 2014-01-27 ENCOUNTER — Other Ambulatory Visit: Payer: Self-pay | Admitting: Gynecology

## 2014-01-27 DIAGNOSIS — D500 Iron deficiency anemia secondary to blood loss (chronic): Secondary | ICD-10-CM

## 2014-01-27 DIAGNOSIS — E7800 Pure hypercholesterolemia, unspecified: Secondary | ICD-10-CM

## 2014-01-27 DIAGNOSIS — D508 Other iron deficiency anemias: Secondary | ICD-10-CM

## 2014-01-27 DIAGNOSIS — D501 Sideropenic dysphagia: Secondary | ICD-10-CM

## 2014-01-27 LAB — CYTOLOGY - PAP

## 2014-02-18 ENCOUNTER — Encounter: Payer: Self-pay | Admitting: Gynecology

## 2014-02-18 ENCOUNTER — Ambulatory Visit (INDEPENDENT_AMBULATORY_CARE_PROVIDER_SITE_OTHER): Payer: BC Managed Care – PPO | Admitting: Gynecology

## 2014-02-18 DIAGNOSIS — N889 Noninflammatory disorder of cervix uteri, unspecified: Secondary | ICD-10-CM

## 2014-02-18 NOTE — Patient Instructions (Signed)
Loop Electrosurgical Excision Procedure Care After Refer to this sheet in the next few weeks. These instructions provide you with information on caring for yourself after your procedure. Your caregiver may also give you more specific instructions. Your treatment has been planned according to current medical practices, but problems sometimes occur. Call your caregiver if you have any problems or questions after your procedure. HOME CARE INSTRUCTIONS   Do not use tampons, douche, or have sexual intercourse for 2 weeks or as directed by your caregiver.  Begin normal activities if you have no or minimal cramping or bleeding, unless directed otherwise by your caregiver.  Take your temperature if you feel sick. Write down your temperature on paper, and tell your caregiver if you have a fever.  Take all medicines as directed by your caregiver.  Keep all your follow-up appointments and Pap tests as directed by your caregiver. SEEK IMMEDIATE MEDICAL CARE IF:   You have bleeding that is heavier or longer than a normal menstrual cycle.  You have bleeding that is bright red.  You have blood clots.  You have a fever.  You have increasing cramps or pain not relieved by medicine.  You develop abdominal pain that does not seem to be related to the same area of earlier cramping and pain.  You are lightheaded, unusually weak, or faint.  You develop painful or bloody urination.  You develop a bad smelling vaginal discharge. MAKE SURE YOU:  Understand these instructions.  Will watch your condition.  Will get help right away if you are not doing well or get worse. Document Released: 02/17/2011 Document Revised: 08/29/2011 Document Reviewed: 02/17/2011 [employer] Patient Information 2015 [employer]. This information is not intended to replace advice given to you by your health care provider. Make sure you discuss any questions you have with your health care provider.  

## 2014-02-18 NOTE — Progress Notes (Addendum)
Patient ID: Patricia Cooper, female   DOB: March 09, 1971, 43 y.o.   MRN: [id number] SHALISHA CLAUSING 17-Aug-1970 [id number]        43 y.o.  [id number] presents for LEEP excision of a bulbous area of her cervix noted at her recent annual exam. Pap smear/HPV was negative. Reviewed with the patient and her husband that I suspected this is probably just normal tissue but since it was not noted previously I recommended that we excise it for pathology. I reviewed the procedure with them to include the risks and postoperative instructions.  Past medical history,surgical history, problem list, medications, allergies, family history and social history were all reviewed and documented in the EPIC chart.  Directed ROS with pertinent positives and negatives documented in the history of present illness/assessment and plan.  Exam: Kim assistant General appearance:  Normal External BUS vagina normal. Cervix with bulbous area anterior lip of the cervix. Physical Exam  Genitourinary:       Colposcopy after acetic acid cleanse is adequate and normal. The bulbous area appears to have transformation zone over lying with normal and no and ectocervical mucosa.  Procedure:  The patient was properly grounded and prepared for the procedure. The cervix was visualized with a LEEP speculum cleansed with acetic acid and colposcopy performed. The cervix surrounding the lesion was circumferentially injected with 2% lidocaine solution with epinephrine 1 / 100,000 dilution, a total of 8 cc's were used. The LEEP generator was set at 67 W cutting 60 W coagulation blend one current. Using the 20 x 12 mm LEEP wand, the LEEP specimen was excised in a several passes.  Ball coagulation was delivered to the LEEP base to achieve hemostasis. The specimen was sent to pathology. The patient tolerated the procedure well and postoperative instructions were discussed with her and a postoperative instruction sheet was given to her. Patient knows to  follow up for pathology results in several days.  Dara Lords MD, 8:53 AM 02/18/2014

## 2014-04-03 ENCOUNTER — Other Ambulatory Visit: Payer: BC Managed Care – PPO

## 2014-04-04 ENCOUNTER — Other Ambulatory Visit: Payer: Self-pay

## 2014-04-21 ENCOUNTER — Encounter: Payer: Self-pay | Admitting: Gynecology

## 2014-12-29 ENCOUNTER — Other Ambulatory Visit: Payer: Self-pay

## 2014-12-29 DIAGNOSIS — Z1231 Encounter for screening mammogram for malignant neoplasm of breast: Secondary | ICD-10-CM

## 2015-01-29 ENCOUNTER — Ambulatory Visit
Admission: RE | Admit: 2015-01-29 | Discharge: 2015-01-29 | Disposition: A | Discharge status: Home / Self Care | Payer: BC Managed Care – PPO | Source: Ambulatory Visit

## 2015-01-29 DIAGNOSIS — Z1231 Encounter for screening mammogram for malignant neoplasm of breast: Secondary | ICD-10-CM

## 2015-03-06 ENCOUNTER — Encounter: Payer: Self-pay | Admitting: Gynecology

## 2015-03-06 ENCOUNTER — Ambulatory Visit (INDEPENDENT_AMBULATORY_CARE_PROVIDER_SITE_OTHER): Payer: BC Managed Care – PPO | Admitting: Gynecology

## 2015-03-06 VITALS — BP 118/76 | Ht 65.0 in | Wt 164.0 lb

## 2015-03-06 DIAGNOSIS — Z01419 Encounter for gynecological examination (general) (routine) without abnormal findings: Secondary | ICD-10-CM

## 2015-03-06 LAB — CBC WITH DIFFERENTIAL/PLATELET
Basophils Absolute: 0.1 10*3/uL (ref 0.0–0.1)
Basophils Relative: 1 % (ref 0–1)
Eosinophils Absolute: 0.4 10*3/uL (ref 0.0–0.7)
Eosinophils Relative: 8 % — ABNORMAL HIGH (ref 0–5)
HEMATOCRIT: 39.6 % (ref 36.0–46.0)
HEMOGLOBIN: 13 g/dL (ref 12.0–15.0)
LYMPHS PCT: 23 % (ref 12–46)
Lymphs Abs: 1.3 10*3/uL (ref 0.7–4.0)
MCH: 31.3 pg (ref 26.0–34.0)
MCHC: 32.8 g/dL (ref 30.0–36.0)
MCV: 95.4 fL (ref 78.0–100.0)
MPV: 9.6 fL (ref 8.6–12.4)
Monocytes Absolute: 0.6 10*3/uL (ref 0.1–1.0)
Monocytes Relative: 11 % (ref 3–12)
NEUTROS ABS: 3.1 10*3/uL (ref 1.7–7.7)
Neutrophils Relative %: 57 % (ref 43–77)
Platelets: 358 10*3/uL (ref 150–400)
RBC: 4.15 MIL/uL (ref 3.87–5.11)
RDW: 13 % (ref 11.5–15.5)
WBC: 5.5 10*3/uL (ref 4.0–10.5)

## 2015-03-06 LAB — LIPID PANEL
CHOL/HDL RATIO: 3.6 ratio (ref <=5.0)
CHOLESTEROL: 218 mg/dL — AB (ref 125–200)
HDL: 61 mg/dL (ref >=46)
LDL Cholesterol: 143 mg/dL — ABNORMAL HIGH (ref <130)
Triglycerides: 68 mg/dL (ref <150)
VLDL: 14 mg/dL (ref <30)

## 2015-03-06 LAB — COMPREHENSIVE METABOLIC PANEL
ALK PHOS: 57 U/L (ref 33–115)
ALT: 16 U/L (ref 6–29)
AST: 17 U/L (ref 10–35)
Albumin: 4.1 g/dL (ref 3.6–5.1)
BUN: 7 mg/dL (ref 7–25)
CHLORIDE: 97 mmol/L — AB (ref 98–110)
CO2: 26 mmol/L (ref 20–31)
CREATININE: 0.55 mg/dL (ref 0.50–1.10)
Calcium: 8.8 mg/dL (ref 8.6–10.2)
GLUCOSE: 81 mg/dL (ref 65–99)
POTASSIUM: 5.2 mmol/L (ref 3.5–5.3)
SODIUM: 129 mmol/L — AB (ref 135–146)
TOTAL PROTEIN: 6.4 g/dL (ref 6.1–8.1)
Total Bilirubin: 0.4 mg/dL (ref 0.2–1.2)

## 2015-03-06 NOTE — Patient Instructions (Signed)

## 2015-03-06 NOTE — Progress Notes (Signed)
Patricia Cooper 01-24-1971 [id number]        44 y.o.  [id number] for annual exam.  Doing well without complaints  Past medical history,surgical history, problem list, medications, allergies, family history and social history were all reviewed and documented as reviewed in the EPIC chart.  ROS:  Performed with pertinent positives and negatives included in the history, assessment and plan.   Additional significant findings :  none   Exam: [facility] person Vitals:   03/06/15 0833  BP: 118/76  Height:  (1.651 m)  Weight: 164 lb (74.39 kg)   General appearance:  Normal affect, orientation and appearance. Skin: Grossly normal HEENT: Without gross lesions.  No cervical or supraclavicular adenopathy. Thyroid normal.  Lungs:  Clear without wheezing, rales or rhonchi Cardiac: RR, without RMG Abdominal:  Soft, nontender, without masses, guarding, rebound, organomegaly or hernia Breasts:  Examined lying and sitting without masses, retractions, discharge or axillary adenopathy. Pelvic:  Ext/BUS/vagina normal  Cervix normal  Uterus anteverted, normal size, shape and contour, midline and mobile nontender   Adnexa  Without masses or tenderness    Anus and perineum  Normal   Rectovaginal  Normal sphincter tone without palpated masses or tenderness.    Assessment/Plan:  44 y.o. G78P2002 female for annual exam with regular menses, tubal sterilization.   1. Pap smear/HPV 2015 negative.  No history of abnormal Pap smears previously. Bulbous area of the cervix excised last year with sleep showed normal tissue. Plan repeat Pap smear at 3-5 year interval. 2. Mammography 01/2015. Continue with annual mammography. SBE monthly review. 3. Health maintenance.  Baseline CBC comprehensive metabolic panel lipid profile urinalysis ordered. Follow up in one year, sooner as needed.   Dara Lords MD, 8:55 AM 03/06/2015

## 2015-03-07 LAB — URINALYSIS W MICROSCOPIC + REFLEX CULTURE
BACTERIA UA: NONE SEEN [HPF]
Bilirubin Urine: NEGATIVE
CASTS: NONE SEEN [LPF]
Crystals: NONE SEEN [HPF]
Glucose, UA: NEGATIVE
HGB URINE DIPSTICK: NEGATIVE
LEUKOCYTES UA: NEGATIVE
NITRITE: NEGATIVE
PROTEIN: NEGATIVE
Specific Gravity, Urine: 1.01 (ref 1.001–1.035)
WBC, UA: NONE SEEN WBC/HPF (ref <=5)
YEAST: NONE SEEN [HPF]
pH: 7 (ref 5.0–8.0)

## 2015-03-08 LAB — URINE CULTURE: Colony Count: 50000

## 2016-02-03 ENCOUNTER — Other Ambulatory Visit: Payer: Self-pay | Admitting: Gynecology

## 2016-02-03 DIAGNOSIS — Z1231 Encounter for screening mammogram for malignant neoplasm of breast: Secondary | ICD-10-CM

## 2016-02-11 ENCOUNTER — Ambulatory Visit: Payer: BC Managed Care – PPO

## 2016-02-23 ENCOUNTER — Ambulatory Visit
Admission: RE | Admit: 2016-02-23 | Discharge: 2016-02-23 | Disposition: A | Discharge status: Home / Self Care | Payer: BC Managed Care – PPO | Source: Ambulatory Visit | Attending: Gynecology | Admitting: Gynecology

## 2016-02-23 DIAGNOSIS — Z1231 Encounter for screening mammogram for malignant neoplasm of breast: Secondary | ICD-10-CM

## 2016-02-24 ENCOUNTER — Other Ambulatory Visit: Payer: Self-pay | Admitting: Gynecology

## 2016-02-24 DIAGNOSIS — R928 Other abnormal and inconclusive findings on diagnostic imaging of breast: Secondary | ICD-10-CM

## 2016-03-01 ENCOUNTER — Ambulatory Visit
Admission: RE | Admit: 2016-03-01 | Discharge: 2016-03-01 | Disposition: A | Discharge status: Home / Self Care | Payer: BC Managed Care – PPO | Source: Ambulatory Visit | Attending: Gynecology | Admitting: Gynecology

## 2016-03-01 DIAGNOSIS — R928 Other abnormal and inconclusive findings on diagnostic imaging of breast: Secondary | ICD-10-CM

## 2016-03-18 ENCOUNTER — Encounter: Payer: BC Managed Care – PPO | Admitting: Gynecology

## 2016-05-06 ENCOUNTER — Encounter: Payer: Self-pay | Admitting: Gynecology

## 2016-05-06 ENCOUNTER — Encounter: Payer: BC Managed Care – PPO | Admitting: Gynecology

## 2016-05-06 ENCOUNTER — Ambulatory Visit (INDEPENDENT_AMBULATORY_CARE_PROVIDER_SITE_OTHER): Payer: BC Managed Care – PPO | Admitting: Gynecology

## 2016-05-06 VITALS — BP 116/70 | Ht 65.0 in | Wt 154.0 lb

## 2016-05-06 DIAGNOSIS — Z01419 Encounter for gynecological examination (general) (routine) without abnormal findings: Secondary | ICD-10-CM | POA: Diagnosis not present

## 2016-05-06 DIAGNOSIS — Z13220 Encounter for screening for lipoid disorders: Secondary | ICD-10-CM

## 2016-05-06 LAB — URINALYSIS W MICROSCOPIC + REFLEX CULTURE
BILIRUBIN URINE: NEGATIVE
CRYSTALS: NONE SEEN [HPF]
Casts: NONE SEEN [LPF]
GLUCOSE, UA: NEGATIVE
HGB URINE DIPSTICK: NEGATIVE
KETONES UR: NEGATIVE
LEUKOCYTES UA: NEGATIVE
Nitrite: NEGATIVE
PROTEIN: NEGATIVE
RBC / HPF: NONE SEEN RBC/HPF (ref <=2)
Specific Gravity, Urine: 1.021 (ref 1.001–1.035)
Yeast: NONE SEEN [HPF]
pH: 7 (ref 5.0–8.0)

## 2016-05-06 LAB — COMPREHENSIVE METABOLIC PANEL
ALBUMIN: 3.9 g/dL (ref 3.6–5.1)
ALT: 9 U/L (ref 6–29)
AST: 14 U/L (ref 10–35)
Alkaline Phosphatase: 44 U/L (ref 33–115)
BILIRUBIN TOTAL: 0.4 mg/dL (ref 0.2–1.2)
BUN: 9 mg/dL (ref 7–25)
CALCIUM: 8.5 mg/dL — AB (ref 8.6–10.2)
CHLORIDE: 99 mmol/L (ref 98–110)
CO2: 24 mmol/L (ref 20–31)
Creat: 0.66 mg/dL (ref 0.50–1.10)
GLUCOSE: 84 mg/dL (ref 65–99)
Potassium: 4.6 mmol/L (ref 3.5–5.3)
Sodium: 129 mmol/L — ABNORMAL LOW (ref 135–146)
Total Protein: 6.3 g/dL (ref 6.1–8.1)

## 2016-05-06 LAB — CBC WITH DIFFERENTIAL/PLATELET
BASOS ABS: 31 {cells}/uL (ref 0–200)
Basophils Relative: 1 %
EOS PCT: 6 %
Eosinophils Absolute: 186 cells/uL (ref 15–500)
HEMATOCRIT: 34.4 % — AB (ref 35.0–45.0)
Hemoglobin: 11.6 g/dL — ABNORMAL LOW (ref 11.7–15.5)
LYMPHS PCT: 36 %
Lymphs Abs: 1116 cells/uL (ref 850–3900)
MCH: 31.4 pg (ref 27.0–33.0)
MCHC: 33.7 g/dL (ref 32.0–36.0)
MCV: 93 fL (ref 80.0–100.0)
MONO ABS: 403 {cells}/uL (ref 200–950)
MPV: 9.8 fL (ref 7.5–12.5)
Monocytes Relative: 13 %
NEUTROS PCT: 44 %
Neutro Abs: 1364 cells/uL — ABNORMAL LOW (ref 1500–7800)
Platelets: 287 10*3/uL (ref 140–400)
RBC: 3.7 MIL/uL — AB (ref 3.80–5.10)
RDW: 12.8 % (ref 11.0–15.0)
WBC: 3.1 10*3/uL — AB (ref 3.8–10.8)

## 2016-05-06 LAB — LIPID PANEL
CHOL/HDL RATIO: 2.6 ratio (ref <5.0)
Cholesterol: 211 mg/dL — ABNORMAL HIGH (ref <200)
HDL: 80 mg/dL (ref >50)
LDL CALC: 122 mg/dL — AB (ref <100)
TRIGLYCERIDES: 43 mg/dL (ref <150)
VLDL: 9 mg/dL (ref <30)

## 2016-05-06 NOTE — Patient Instructions (Signed)

## 2016-05-06 NOTE — Progress Notes (Signed)
    Patricia CoddingtonKristine F Cooper 01/25/1971 [id number]        45 y.o.  [id number]  for annual exam.    Past medical history,surgical history, problem list, medications, allergies, family history and social history were all reviewed and documented as reviewed in the EPIC chart.  ROS:  Performed with pertinent positives and negatives included in the history, assessment and plan.   Additional significant findings :  None   Exam: [facility] Port[facility] assistant Vitals:   05/06/16 0845  BP: 116/70  Weight: 154 lb (69.9 kg)  Height: 5\' 5"  (1.651 m)   Body mass index is 25.63 kg/m.  General appearance:  Normal affect, orientation and appearance. Skin: Grossly normal HEENT: Without gross lesions.  No cervical or supraclavicular adenopathy. Thyroid normal.  Lungs:  Clear without wheezing, rales or rhonchi Cardiac: RR, without RMG Abdominal:  Soft, nontender, without masses, guarding, rebound, organomegaly or hernia Breasts:  Examined lying and sitting without masses, retractions, discharge or axillary adenopathy. Pelvic:  Ext, BUS, Vagina normal  Cervix normal  Uterus anteverted, normal size, shape and contour, midline and mobile nontender   Adnexa without masses or tenderness    Anus and perineum normal   Rectovaginal normal sphincter tone without palpated masses or tenderness.    Assessment/Plan:  45 y.o. 512P2002 female for annual exam with regular menses, tubal sterilization.   1. Pap smear/HPV 2015 normal. No Pap smear done today. No history of abnormal Pap smears previously. 2. Mammography 02/2016. Continue with annual mammography when due. SBE monthly reviewed. 3. Health maintenance. Baseline CBC, CMP, lipid profile, urinalysis ordered. Follow up 1 year, sooner as needed.   Dara LordsFONTAINE,Via Rosado P MD, 9:05 AM 05/06/2016

## 2016-05-07 LAB — URINE CULTURE

## 2016-05-09 ENCOUNTER — Other Ambulatory Visit: Payer: Self-pay | Admitting: Gynecology

## 2016-05-09 DIAGNOSIS — D649 Anemia, unspecified: Secondary | ICD-10-CM

## 2017-05-29 ENCOUNTER — Ambulatory Visit: Payer: BC Managed Care – PPO | Admitting: Gynecology

## 2017-06-01 ENCOUNTER — Encounter: Payer: Self-pay | Admitting: Women's Health

## 2017-06-01 ENCOUNTER — Ambulatory Visit: Payer: BC Managed Care – PPO | Admitting: Women's Health

## 2017-06-01 VITALS — BP 120/80 | Ht 64.0 in | Wt 160.0 lb

## 2017-06-01 DIAGNOSIS — N938 Other specified abnormal uterine and vaginal bleeding: Secondary | ICD-10-CM

## 2017-06-01 DIAGNOSIS — Z13220 Encounter for screening for lipoid disorders: Secondary | ICD-10-CM

## 2017-06-01 DIAGNOSIS — Z01419 Encounter for gynecological examination (general) (routine) without abnormal findings: Secondary | ICD-10-CM

## 2017-06-01 NOTE — Progress Notes (Signed)
Patricia CoddingtonKristine F Haegele 11/21/1970 [id number]    History:    Presents for annual exam.  History of a regular monthly cycle/BTL. States has been bleeding irregularly for the past 2 months with heavy bleeding last week. Bleeding has now subsided. Normal Pap history. Has had sinus surgery last year, history restless leg syndrome and epilepsy. Has had medication changes but not recently. Same partner. Reports mother had similar type problems in her 6140s necessitating a hysterectomy.  Past medical history, past surgical history, family history and social history were all reviewed and documented in the EPIC chart. 2 children ages 5324 and 4216 both doing well. Teacher.  ROS:  A ROS was performed and pertinent positives and negatives are included.  Exam:  Vitals:   06/01/17 0914  BP: 120/80  Weight: 160 lb (72.6 kg)  Height: 5\' 4"  (1.626 m)   Body mass index is 27.46 kg/m.   General appearance:  Normal Thyroid:  Symmetrical, normal in size, without palpable masses or nodularity. Respiratory  Auscultation:  Clear without wheezing or rhonchi Cardiovascular  Auscultation:  Regular rate, without rubs, murmurs or gallops  Edema/varicosities:  Not grossly evident Abdominal  Soft,nontender, without masses, guarding or rebound.  Liver/spleen:  No organomegaly noted  Hernia:  None appreciated  Skin  Inspection:  Grossly normal   Breasts: Examined lying and sitting.     Right: Without masses, retractions, discharge or axillary adenopathy.     Left: Without masses, retractions, discharge or axillary adenopathy. Gentitourinary   Inguinal/mons:  Normal without inguinal adenopathy  External genitalia:  Normal  BUS/Urethra/Skene's glands:  Normal  Vagina:  Normal  Cervix:  Normal visible lesions, discharge or polyp  Uterus:  normal in size, shape and contour.  Midline and mobile  Adnexa/parametria:     Rt: Without masses or tenderness.   Lt: Without masses or tenderness.  Anus and  perineum: Normal  Digital rectal exam: Normal sphincter tone without palpated masses or tenderness  Assessment/Plan:  46 y.o. MWF G2 P2  for annual exam.   DUB  2 months/BTL Epilepsy/restless leg syndrome-neurologist managing meds  Plan: SBE's, annual screening mammogram, breast center information given instructed to schedule, calcium rich diet, vitamin D 1000 daily and regular exercise encouraged. Schedule sonohysterogram with Dr. Audie BoxFontaine. CBC, TSH, lipid panel, CMP, Pap with HR HPV typing, new screening guidelines reviewed.    Harrington Challengerancy J Young [facility] LtdWHNP, 12:44 PM 06/01/2017

## 2017-06-01 NOTE — Patient Instructions (Signed)
Health Maintenance, Female Adopting a healthy lifestyle and getting preventive care can go a long way to promote health and wellness. Talk with your health care provider about what schedule of regular examinations is right for you. This is a good chance for you to check in with your provider about disease prevention and staying healthy. In between checkups, there are plenty of things you can do on your own. Experts have done a lot of research about which lifestyle changes and preventive measures are most likely to keep you healthy. Ask your health care provider for more information. Weight and diet Eat a healthy diet  Be sure to include plenty of vegetables, fruits, low-fat dairy products, and lean protein.  Do not eat a lot of foods high in solid fats, added sugars, or salt.  Get regular exercise. This is one of the most important things you can do for your health. ? Most adults should exercise for at least 150 minutes each week. The exercise should increase your heart rate and make you sweat (moderate-intensity exercise). ? Most adults should also do strengthening exercises at least twice a week. This is in addition to the moderate-intensity exercise.  Maintain a healthy weight  Body mass index (BMI) is a measurement that can be used to identify possible weight problems. It estimates body fat based on height and weight. Your health care provider can help determine your BMI and help you achieve or maintain a healthy weight.  For females 20 years of age and older: ? A BMI below 18.5 is considered underweight. ? A BMI of 18.5 to 24.9 is normal. ? A BMI of 25 to 29.9 is considered overweight. ? A BMI of 30 and above is considered obese.  Watch levels of cholesterol and blood lipids  You should start having your blood tested for lipids and cholesterol at 48 years of age, then have this test every 5 years.  You may need to have your cholesterol levels checked more often if: ? Your lipid or  cholesterol levels are high. ? You are older than 48 years of age. ? You are at high risk for heart disease.  Cancer screening Lung Cancer  Lung cancer screening is recommended for adults 55-80 years old who are at high risk for lung cancer because of a history of smoking.  A yearly low-dose CT scan of the lungs is recommended for people who: ? Currently smoke. ? Have quit within the past 15 years. ? Have at least a 30-pack-year history of smoking. A pack year is smoking an average of one pack of cigarettes a day for 1 year.  Yearly screening should continue until it has been 15 years since you quit.  Yearly screening should stop if you develop a health problem that would prevent you from having lung cancer treatment.  Breast Cancer  Practice breast self-awareness. This means understanding how your breasts normally appear and feel.  It also means doing regular breast self-exams. Let your health care provider know about any changes, no matter how small.  If you are in your 20s or 30s, you should have a clinical breast exam (CBE) by a health care provider every 1-3 years as part of a regular health exam.  If you are 40 or older, have a CBE every year. Also consider having a breast X-ray (mammogram) every year.  If you have a family history of breast cancer, talk to your health care provider about genetic screening.  If you are at high risk   for breast cancer, talk to your health care provider about having an MRI and a mammogram every year.  Breast cancer gene (BRCA) assessment is recommended for women who have family members with BRCA-related cancers. BRCA-related cancers include: ? Breast. ? Ovarian. ? Tubal. ? Peritoneal cancers.  Results of the assessment will determine the need for genetic counseling and BRCA1 and BRCA2 testing.  Cervical Cancer Your health care provider may recommend that you be screened regularly for cancer of the pelvic organs (ovaries, uterus, and  vagina). This screening involves a pelvic examination, including checking for microscopic changes to the surface of your cervix (Pap test). You may be encouraged to have this screening done every 3 years, beginning at age 22.  For women ages 56-65, health care providers may recommend pelvic exams and Pap testing every 3 years, or they may recommend the Pap and pelvic exam, combined with testing for human papilloma virus (HPV), every 5 years. Some types of HPV increase your risk of cervical cancer. Testing for HPV may also be done on women of any age with unclear Pap test results.  Other health care providers may not recommend any screening for nonpregnant women who are considered low risk for pelvic cancer and who do not have symptoms. Ask your health care provider if a screening pelvic exam is right for you.  If you have had past treatment for cervical cancer or a condition that could lead to cancer, you need Pap tests and screening for cancer for at least 20 years after your treatment. If Pap tests have been discontinued, your risk factors (such as having a new sexual partner) need to be reassessed to determine if screening should resume. Some women have medical problems that increase the chance of getting cervical cancer. In these cases, your health care provider may recommend more frequent screening and Pap tests.  Colorectal Cancer  This type of cancer can be detected and often prevented.  Routine colorectal cancer screening usually begins at 48 years of age and continues through 48 years of age.  Your health care provider may recommend screening at an earlier age if you have risk factors for colon cancer.  Your health care provider may also recommend using home test kits to check for hidden blood in the stool.  A small camera at the end of a tube can be used to examine your colon directly (sigmoidoscopy or colonoscopy). This is done to check for the earliest forms of colorectal  cancer.  Routine screening usually begins at age 33.  Direct examination of the colon should be repeated every 5-10 years through 48 years of age. However, you may need to be screened more often if early forms of precancerous polyps or small growths are found.  Skin Cancer  Check your skin from head to toe regularly.  Tell your health care provider about any new moles or changes in moles, especially if there is a change in a mole's shape or color.  Also tell your health care provider if you have a mole that is larger than the size of a pencil eraser.  Always use sunscreen. Apply sunscreen liberally and repeatedly throughout the day.  Protect yourself by wearing long sleeves, pants, a wide-brimmed hat, and sunglasses whenever you are outside.  Heart disease, diabetes, and high blood pressure  High blood pressure causes heart disease and increases the risk of stroke. High blood pressure is more likely to develop in: ? People who have blood pressure in the high end of  the normal range (130-139/85-89 mm Hg). ? People who are overweight or obese. ? People who are African American.  If you are 21-29 years of age, have your blood pressure checked every 3-5 years. If you are 3 years of age or older, have your blood pressure checked every year. You should have your blood pressure measured twice-once when you are at a hospital or clinic, and once when you are not at a hospital or clinic. Record the average of the two measurements. To check your blood pressure when you are not at a hospital or clinic, you can use: ? An automated blood pressure machine at a pharmacy. ? A home blood pressure monitor.  If you are between 17 years and 37 years old, ask your health care provider if you should take aspirin to prevent strokes.  Have regular diabetes screenings. This involves taking a blood sample to check your fasting blood sugar level. ? If you are at a normal weight and have a low risk for diabetes,  have this test once every three years after 48 years of age. ? If you are overweight and have a high risk for diabetes, consider being tested at a younger age or more often. Preventing infection Hepatitis B  If you have a higher risk for hepatitis B, you should be screened for this virus. You are considered at high risk for hepatitis B if: ? You were born in a country where hepatitis B is common. Ask your health care provider which countries are considered high risk. ? Your parents were born in a high-risk country, and you have not been immunized against hepatitis B (hepatitis B vaccine). ? You have HIV or AIDS. ? You use needles to inject street drugs. ? You live with someone who has hepatitis B. ? You have had sex with someone who has hepatitis B. ? You get hemodialysis treatment. ? You take certain medicines for conditions, including cancer, organ transplantation, and autoimmune conditions.  Hepatitis C  Blood testing is recommended for: ? Everyone born from [birth year] through 1967. ? Anyone with known risk factors for hepatitis C.  Sexually transmitted infections (STIs)  You should be screened for sexually transmitted infections (STIs) including gonorrhea and chlamydia if: ? You are sexually active and are younger than 48 years of age. ? You are older than 48 years of age and your health care provider tells you that you are at risk for this type of infection. ? Your sexual activity has changed since you were last screened and you are at an increased risk for chlamydia or gonorrhea. Ask your health care provider if you are at risk.  If you do not have HIV, but are at risk, it may be recommended that you take a prescription medicine daily to prevent HIV infection. This is called pre-exposure prophylaxis (PrEP). You are considered at risk if: ? You are sexually active and do not regularly use condoms or know the HIV status of your partner(s). ? You take drugs by injection. ? You are  sexually active with a partner who has HIV.  Talk with your health care provider about whether you are at high risk of being infected with HIV. If you choose to begin PrEP, you should first be tested for HIV. You should then be tested every 3 months for as long as you are taking PrEP. Pregnancy  If you are premenopausal and you may become pregnant, ask your health care provider about preconception counseling.  If you may become  pregnant, take 400 to 800 micrograms (mcg) of folic acid every day.  If you want to prevent pregnancy, talk to your health care provider about birth control (contraception). Osteoporosis and menopause  Osteoporosis is a disease in which the bones lose minerals and strength with aging. This can result in serious bone fractures. Your risk for osteoporosis can be identified using a bone density scan.  If you are 41 years of age or older, or if you are at risk for osteoporosis and fractures, ask your health care provider if you should be screened.  Ask your health care provider whether you should take a calcium or vitamin D supplement to lower your risk for osteoporosis.  Menopause may have certain physical symptoms and risks.  Hormone replacement therapy may reduce some of these symptoms and risks. Talk to your health care provider about whether hormone replacement therapy is right for you. Follow these instructions at home:  Schedule regular health, dental, and eye exams.  Stay current with your immunizations.  Do not use any tobacco products including cigarettes, chewing tobacco, or electronic cigarettes.  If you are pregnant, do not drink alcohol.  If you are breastfeeding, limit how much and how often you drink alcohol.  Limit alcohol intake to no more than 1 drink per day for nonpregnant women. One drink equals 12 ounces of beer, 5 ounces of wine, or 1 ounces of hard liquor.  Do not use street drugs.  Do not share needles.  Ask your health care  provider for help if you need support or information about quitting drugs.  Tell your health care provider if you often feel depressed.  Tell your health care provider if you have ever been abused or do not feel safe at home. This information is not intended to replace advice given to you by your health care provider. Make sure you discuss any questions you have with your health care provider. Document Released: 12/20/2010 Document Revised: 11/12/2015 Document Reviewed: 03/10/2015 Elsevier Interactive Patient Education  2018 [employer]. Dysfunctional Uterine Bleeding Dysfunctional uterine bleeding is abnormal bleeding from the uterus. Dysfunctional uterine bleeding includes:  A period that comes earlier or later than usual.  A period that is lighter, heavier, or has blood clots.  Bleeding between periods.  Skipping one or more periods.  Bleeding after sexual intercourse.  Bleeding after menopause.  Follow these instructions at home: Pay attention to any changes in your symptoms. Follow these instructions to help with your condition: Eating and drinking  Eat well-balanced meals. Include foods that are high in iron, such as liver, meat, shellfish, green leafy vegetables, and eggs.  If you become constipated: ? Drink plenty of water. ? Eat fruits and vegetables that are high in water and fiber, such as spinach, carrots, raspberries, apples, and mango. Medicines  Take over-the-counter and prescription medicines only as told by your health care provider.  Do not change medicines without talking with your health care provider.  Aspirin or medicines that contain aspirin may make the bleeding worse. Do not take those medicines: ? During the week before your period. ? During your period.  If you were prescribed iron pills, take them as told by your health care provider. Iron pills help to replace iron that your body loses because of this condition. Activity  If you need to  change your sanitary pad or tampon more than one time every 2 hours: ? Lie in bed with your feet raised (elevated). ? Place a cold pack on your  lower abdomen. ? Rest as much as possible until the bleeding stops or slows down.  Do not try to lose weight until the bleeding has stopped and your blood iron level is back to normal. Other Instructions  For two months, write down: ? When your period starts. ? When your period ends. ? When any abnormal bleeding occurs. ? What problems you notice.  Keep all follow up visits as told by your health care provider. This is important. Contact a health care provider if:  You get light-headed or weak.  You have nausea and vomiting.  You cannot eat or drink without vomiting.  You feel dizzy or have diarrhea while you are taking medicines.  You are taking birth control pills or hormones, and you want to change them or stop taking them. Get help right away if:  You develop a fever or chills.  You need to change your sanitary pad or tampon more than one time per hour.  Your bleeding becomes heavier, or your flow contains clots more often.  You develop pain in your abdomen.  You lose consciousness.  You develop a rash. This information is not intended to replace advice given to you by your health care provider. Make sure you discuss any questions you have with your health care provider. Document Released: 06/03/2000 Document Revised: 11/12/2015 Document Reviewed: 09/01/2014 Elsevier Interactive Patient Education  [employer].

## 2017-06-01 NOTE — Progress Notes (Signed)
l °

## 2017-06-02 ENCOUNTER — Encounter: Payer: Self-pay | Admitting: Women's Health

## 2017-06-02 LAB — CBC WITH DIFFERENTIAL/PLATELET
BASOS ABS: 41 {cells}/uL (ref 0–200)
Basophils Relative: 0.9 %
EOS ABS: 423 {cells}/uL (ref 15–500)
Eosinophils Relative: 9.2 %
HCT: 38.5 % (ref 35.0–45.0)
HEMOGLOBIN: 12.7 g/dL (ref 11.7–15.5)
Lymphs Abs: 1306 cells/uL (ref 850–3900)
MCH: 31.5 pg (ref 27.0–33.0)
MCHC: 33 g/dL (ref 32.0–36.0)
MCV: 95.5 fL (ref 80.0–100.0)
MONOS PCT: 9.5 %
MPV: 9.9 fL (ref 7.5–12.5)
NEUTROS ABS: 2392 {cells}/uL (ref 1500–7800)
Neutrophils Relative %: 52 %
Platelets: 400 10*3/uL (ref 140–400)
RBC: 4.03 10*6/uL (ref 3.80–5.10)
RDW: 11.9 % (ref 11.0–15.0)
TOTAL LYMPHOCYTE: 28.4 %
WBC: 4.6 10*3/uL (ref 3.8–10.8)
WBCMIX: 437 {cells}/uL (ref 200–950)

## 2017-06-02 LAB — PAP, TP IMAGING W/ HPV RNA, RFLX HPV TYPE 16,18/45: HPV DNA High Risk: NOT DETECTED

## 2017-06-02 LAB — COMPREHENSIVE METABOLIC PANEL
AG RATIO: 1.6 (calc) (ref 1.0–2.5)
ALT: 13 U/L (ref 6–29)
AST: 16 U/L (ref 10–35)
Albumin: 4.1 g/dL (ref 3.6–5.1)
Alkaline phosphatase (APISO): 52 U/L (ref 33–115)
BUN: 8 mg/dL (ref 7–25)
CHLORIDE: 100 mmol/L (ref 98–110)
CO2: 26 mmol/L (ref 20–32)
CREATININE: 0.56 mg/dL (ref 0.50–1.10)
Calcium: 9.1 mg/dL (ref 8.6–10.2)
GLOBULIN: 2.5 g/dL (ref 1.9–3.7)
GLUCOSE: 91 mg/dL (ref 65–99)
Potassium: 4.3 mmol/L (ref 3.5–5.3)
SODIUM: 135 mmol/L (ref 135–146)
TOTAL PROTEIN: 6.6 g/dL (ref 6.1–8.1)
Total Bilirubin: 0.4 mg/dL (ref 0.2–1.2)

## 2017-06-02 LAB — LIPID PANEL
CHOL/HDL RATIO: 2.5 (calc) (ref <5.0)
Cholesterol: 217 mg/dL — ABNORMAL HIGH (ref <200)
HDL: 88 mg/dL (ref >50)
LDL Cholesterol (Calc): 115 mg/dL (calc) — ABNORMAL HIGH
NON-HDL CHOLESTEROL (CALC): 129 mg/dL (ref <130)
TRIGLYCERIDES: 56 mg/dL (ref <150)

## 2017-06-02 LAB — TSH: TSH: 1.02 m[IU]/L

## 2017-06-19 ENCOUNTER — Other Ambulatory Visit: Payer: Self-pay | Admitting: Gynecology

## 2017-06-19 DIAGNOSIS — N939 Abnormal uterine and vaginal bleeding, unspecified: Secondary | ICD-10-CM

## 2017-06-20 HISTORY — PX: BREAST EXCISIONAL BIOPSY: SUR124

## 2017-06-28 ENCOUNTER — Ambulatory Visit (INDEPENDENT_AMBULATORY_CARE_PROVIDER_SITE_OTHER): Payer: BC Managed Care – PPO

## 2017-06-28 ENCOUNTER — Encounter: Payer: Self-pay | Admitting: Gynecology

## 2017-06-28 ENCOUNTER — Ambulatory Visit: Payer: BC Managed Care – PPO | Admitting: Gynecology

## 2017-06-28 VITALS — BP 118/76

## 2017-06-28 DIAGNOSIS — N938 Other specified abnormal uterine and vaginal bleeding: Secondary | ICD-10-CM

## 2017-06-28 DIAGNOSIS — N939 Abnormal uterine and vaginal bleeding, unspecified: Secondary | ICD-10-CM

## 2017-06-28 DIAGNOSIS — N889 Noninflammatory disorder of cervix uteri, unspecified: Secondary | ICD-10-CM

## 2017-06-28 NOTE — Progress Notes (Signed)
    Patricia CoddingtonKristine F Lollar 06/19/1971 [id number]        47 y.o.  [id number] presents having recently seen Harriett SineNancy for her annual exam noting for the last several months she has had bleeding on and off with episodes of heavy bleeding.  Notes now that her bleeding has resolved.  Pap smear 05/2017 normal.  Status post BTL.  Past medical history,surgical history, problem list, medications, allergies, family history and social history were all reviewed and documented in the EPIC chart.  Directed ROS with pertinent positives and negatives documented in the history of present illness/assessment and plan.  Exam: [place] assistant BP 118/76 General appearance:  Normal Abdomen soft nontender without masses guarding rebound Pelvic external BUS vagina normal.  Cervix with bulbous area posterior lip questionable large nabothian cyst.  Uterus grossly normal size midline mobile nontender.  Adnexa without masses or tenderness.  Ultrasound: Transvaginal and transabdominal shows uterus normal size and echotexture.  Endometrial echo 7.8 mm.  Small echogenic focus noted 6 x 4 mm.  Right and left ovaries normal.  Cul-de-sac negative.  Sonohysterogram: Performed with sterile technique, easy catheter introduction, good distention with no abnormality seen.  Endometrial biopsy taken.  Patient tolerated well.  Assessment/Plan:  [name] with 2 months of irregular bleeding but now resolved.  Sonohysterogram was normal.  Initially small echogenic area noted along endometrial lining but on sonohysterogram this was not apparent.  Patient will follow-up for biopsy results.  Suspect hormonal dysfunction as source of her irregular bleeding and we will plan expectant management for now with follow-up if she continues to have irregular bleeding.  Patient also has a bulbous area of posterior lip of the cervix.  Questionable nabothian cyst.  I did recommend following up for a colposcopy appointment so that we can better inspect this  area and possible biopsy if indicated.  Pap smear last month was normal.  The patient agrees to schedule and follow-up for this.    Dara Lordsimothy P Herrick Hartog MD, 10:46 AM 06/28/2017

## 2017-06-28 NOTE — Patient Instructions (Signed)
Follow up for the colposcopy appt as scheduled

## 2017-07-05 ENCOUNTER — Encounter: Payer: BC Managed Care – PPO | Admitting: Gynecology

## 2017-07-21 DIAGNOSIS — R87612 Low grade squamous intraepithelial lesion on cytologic smear of cervix (LGSIL): Secondary | ICD-10-CM

## 2017-07-21 HISTORY — DX: Low grade squamous intraepithelial lesion on cytologic smear of cervix (LGSIL): R87.612

## 2017-07-28 ENCOUNTER — Encounter: Payer: Self-pay | Admitting: Gynecology

## 2017-07-28 ENCOUNTER — Ambulatory Visit: Payer: BC Managed Care – PPO | Admitting: Gynecology

## 2017-07-28 VITALS — BP 116/74

## 2017-07-28 DIAGNOSIS — N889 Noninflammatory disorder of cervix uteri, unspecified: Secondary | ICD-10-CM | POA: Diagnosis not present

## 2017-07-28 NOTE — Progress Notes (Signed)
    Patricia Cooper Jul 26, 1970 [id number]        47 y.o.  [id number] presents for colposcopy with recent exam showing bulbous area posterior lip of the cervix not appreciated previously.  Had been evaluated with irregular bleeding to include a negative sonohysterogram and negative endometrial biopsy.  Recent Pap smear was negative in November.   Past medical history,surgical history, problem list, medications, allergies, family history and social history were all reviewed and documented in the EPIC chart.  Directed ROS with pertinent positives and negatives documented in the history of present illness/assessment and plan.  Exam: [name] assistant Vitals:   07/28/17 0914  BP: 116/74   General appearance:  Normal Abdomen soft nontender without masses guarding rebound Pelvic external BUS vagina normal.  Cervix with bulbous marblelike area posterior lip of the cervix.  Uterus normal size midline mobile nontender.  Adnexa without masses or tenderness.   Colposcopy performed after acetic acid cleanse is adequate with the bulbous marblelike area noted but no other abnormalities along the transformation zone. Physical Exam  Genitourinary:       Procedure: The base of this bulbous area was infiltrated with 2% lidocaine with 1-100,000 epinephrine dilution, 8 cc total.  Using the LEEP 20 x 12 wand on a setting of 60 W cutting 90 W coagulation blend 1 initial attempt to excise the entire area was met with dense fibrous like tissue making cutting through it difficult.  2 separate passes were made more superficially to excise a generous biopsy of the area although there was remaining tissue that was higher in the cervix which I felt uncomfortable to pursue with the LEEP in the office.  There was no significant bleeding but prophylactic Monsel's was applied afterwards.  The patient tolerated the procedure well.  Assessment/Plan:  47 y.o. [id number] with fibrous bulbous area posterior lip of the cervix.   Suspect possible myoma/fibroma although could be dense normal cervical stroma.  Patient will follow-up for biopsy results.  Discussed options to include attempting excising the entire area intraoperatively versus if benign biopsy as it is not causing her any issues then just expectant management.  Will further discuss when her biopsy results return.    Anastasio Auerbach MD, 9:41 AM 07/28/2017

## 2017-07-28 NOTE — Addendum Note (Signed)
Addended by: Dara LordsFONTAINE, Markie Frith P on: 07/28/2017 10:47 AM   Modules accepted: Orders

## 2017-07-28 NOTE — Patient Instructions (Signed)
Office will call you with biopsy results 

## 2017-08-03 ENCOUNTER — Telehealth: Payer: Self-pay | Admitting: *Deleted

## 2017-08-03 NOTE — Telephone Encounter (Signed)
Pt called and left message in voicemail requesting biopsy from [facility] on 07/28/17. I checked with Joni Reiningicole in lab she looked up results and it is still pending, she states the pathologist received specimen on 07/31/17. I called pt and left this on her voicemail.

## 2017-08-04 ENCOUNTER — Encounter: Payer: Self-pay | Admitting: Gynecology

## 2017-08-04 LAB — PATHOLOGY

## 2017-08-04 LAB — TISSUE SPECIMEN

## 2018-01-22 ENCOUNTER — Other Ambulatory Visit: Payer: Self-pay | Admitting: Family Medicine

## 2018-01-22 DIAGNOSIS — Z1231 Encounter for screening mammogram for malignant neoplasm of breast: Secondary | ICD-10-CM

## 2018-02-23 ENCOUNTER — Ambulatory Visit
Admission: RE | Admit: 2018-02-23 | Discharge: 2018-02-23 | Disposition: A | Discharge status: Home / Self Care | Payer: BC Managed Care – PPO | Source: Ambulatory Visit | Attending: Family Medicine | Admitting: Family Medicine

## 2018-02-23 DIAGNOSIS — Z1231 Encounter for screening mammogram for malignant neoplasm of breast: Secondary | ICD-10-CM

## 2018-02-27 ENCOUNTER — Other Ambulatory Visit: Payer: Self-pay | Admitting: Gynecology

## 2018-02-27 DIAGNOSIS — R921 Mammographic calcification found on diagnostic imaging of breast: Secondary | ICD-10-CM

## 2018-03-02 ENCOUNTER — Ambulatory Visit
Admission: RE | Admit: 2018-03-02 | Discharge: 2018-03-02 | Disposition: A | Discharge status: Home / Self Care | Payer: BC Managed Care – PPO | Source: Ambulatory Visit | Attending: Gynecology | Admitting: Gynecology

## 2018-03-02 ENCOUNTER — Other Ambulatory Visit: Payer: Self-pay | Admitting: Gynecology

## 2018-03-02 DIAGNOSIS — R921 Mammographic calcification found on diagnostic imaging of breast: Secondary | ICD-10-CM

## 2018-03-05 ENCOUNTER — Ambulatory Visit
Admission: RE | Admit: 2018-03-05 | Discharge: 2018-03-05 | Disposition: A | Discharge status: Home / Self Care | Payer: BC Managed Care – PPO | Source: Ambulatory Visit | Attending: Gynecology | Admitting: Gynecology

## 2018-03-05 DIAGNOSIS — R921 Mammographic calcification found on diagnostic imaging of breast: Secondary | ICD-10-CM

## 2018-04-09 ENCOUNTER — Other Ambulatory Visit: Payer: Self-pay | Admitting: General Surgery

## 2018-04-09 DIAGNOSIS — N6091 Unspecified benign mammary dysplasia of right breast: Secondary | ICD-10-CM

## 2018-04-10 ENCOUNTER — Other Ambulatory Visit: Payer: Self-pay | Admitting: General Surgery

## 2018-04-10 DIAGNOSIS — N6091 Unspecified benign mammary dysplasia of right breast: Secondary | ICD-10-CM

## 2018-04-27 NOTE — Pre-Procedure Instructions (Signed)
Patricia Cooper  04/27/2018      [facility] - [place] - [place], [place] - [address] [place] [address] Phone: [phone] Fax: [phone]    Your procedure is scheduled on Monday November 18.  Report to [facility] Admitting at 11:00 A.M.  Call this number if you have problems the morning of surgery:  [phone]   Remember:  Do not eat or drink after midnight.  You may drink clear liquids until 10:00 AM (3 hours prior to surgery) .  Clear liquids allowed are:     Water, Juice (non-citric and without pulp), Clear Tea, Black Coffee only and Gatorade   DRINK ENSURE pre-surgery drink by 10:00 AM (3 hours prior to surgery) the day of surgery.     Take these medicines the morning of surgery with A SIP OF WATER:   Carbamazepine (Tegretol) Levetiracetam (Keppra) Advair Albuterol if needed  STOP TAKING Zyrtec-D now (do not take any medication with pseudoephedrine)  7 days prior to surgery STOP taking any Aspirin(unless otherwise instructed by your surgeon), Aleve, Naproxen, Ibuprofen, Motrin, Advil, Goody's, BC's, all herbal medications, fish oil, and all vitamins     Do not wear jewelry, make-up or nail polish.  Do not wear lotions, powders, or perfumes, or deodorant.  Do not shave 48 hours prior to surgery.  Men may shave face and neck.  Do not bring valuables to the hospital.  [facility] is not responsible for any belongings or valuables.  Contacts, dentures or bridgework may not be worn into surgery.  Leave your suitcase in the car.  After surgery it may be brought to your room.  For patients admitted to the hospital, discharge time will be determined by your treatment team.  Patients discharged the day of surgery will not be allowed to drive home.    Special instructions:     [place]- Preparing For Surgery  Before surgery, you can play an important role. Because skin is not sterile, your skin needs to be as  free of germs as possible. You can reduce the number of germs on your skin by washing with CHG (chlorahexidine gluconate) Soap before surgery.  CHG is an antiseptic cleaner which kills germs and bonds with the skin to continue killing germs even after washing.    Oral Hygiene is also important to reduce your risk of infection.  Remember - BRUSH YOUR TEETH THE MORNING OF SURGERY WITH YOUR REGULAR TOOTHPASTE  Please do not use if you have an allergy to CHG or antibacterial soaps. If your skin becomes reddened/irritated stop using the CHG.  Do not shave (including legs and underarms) for at least 48 hours prior to first CHG shower. It is OK to shave your face.  Please follow these instructions carefully.   1. Shower the NIGHT BEFORE SURGERY and the MORNING OF SURGERY with CHG.   2. If you chose to wash your hair, wash your hair first as usual with your normal shampoo.  3. After you shampoo, rinse your hair and body thoroughly to remove the shampoo.  4. Use CHG as you would any other liquid soap. You can apply CHG directly to the skin and wash gently with a scrungie or a clean washcloth.   5. Apply the CHG Soap to your body ONLY FROM THE NECK DOWN.  Do not use on open wounds or open sores. Avoid contact with your eyes, ears, mouth and  genitals (private parts). Wash Face and genitals (private parts)  with your normal soap.  6. Wash thoroughly, paying special attention to the area where your surgery will be performed.  7. Thoroughly rinse your body with warm water from the neck down.  8. DO NOT shower/wash with your normal soap after using and rinsing off the CHG Soap.  9. Pat yourself dry with a CLEAN TOWEL.  10. Wear CLEAN PAJAMAS to bed the night before surgery, wear comfortable clothes the morning of surgery  11. Place CLEAN SHEETS on your bed the night of your first shower and DO NOT SLEEP WITH PETS.    Day of Surgery:  Do not apply any deodorants/lotions.  Please wear clean  clothes to the hospital/surgery center.   Remember to brush your teeth WITH YOUR REGULAR TOOTHPASTE.    Please read over the following fact sheets that you were given. Coughing and Deep Breathing and Surgical Site Infection Prevention

## 2018-04-30 ENCOUNTER — Encounter (HOSPITAL_COMMUNITY): Payer: Self-pay

## 2018-04-30 ENCOUNTER — Other Ambulatory Visit: Payer: Self-pay

## 2018-04-30 ENCOUNTER — Encounter (HOSPITAL_COMMUNITY)
Admission: RE | Admit: 2018-04-30 | Discharge: 2018-04-30 | Disposition: A | Discharge status: Home / Self Care | Payer: BC Managed Care – PPO | Source: Ambulatory Visit | Attending: General Surgery | Admitting: General Surgery

## 2018-04-30 DIAGNOSIS — N6091 Unspecified benign mammary dysplasia of right breast: Secondary | ICD-10-CM | POA: Insufficient documentation

## 2018-04-30 DIAGNOSIS — Z01818 Encounter for other preprocedural examination: Secondary | ICD-10-CM | POA: Diagnosis not present

## 2018-04-30 HISTORY — DX: Anemia, unspecified: D64.9

## 2018-04-30 HISTORY — DX: Epilepsy, unspecified, not intractable, without status epilepticus: G40.909

## 2018-04-30 LAB — CBC
HCT: 35.3 % — ABNORMAL LOW (ref 36.0–46.0)
Hemoglobin: 11.3 g/dL — ABNORMAL LOW (ref 12.0–15.0)
MCH: 30 pg (ref 26.0–34.0)
MCHC: 32 g/dL (ref 30.0–36.0)
MCV: 93.6 fL (ref 80.0–100.0)
PLATELETS: 348 10*3/uL (ref 150–400)
RBC: 3.77 MIL/uL — ABNORMAL LOW (ref 3.87–5.11)
RDW: 13.8 % (ref 11.5–15.5)
WBC: 4.4 10*3/uL (ref 4.0–10.5)
nRBC: 0 % (ref 0.0–0.2)

## 2018-04-30 NOTE — Progress Notes (Signed)
PCP - Ramiro Harvest NP  Chest x-ray - N/A  EKG - 04/30/18  Blood Thinner Instructions:N/A Aspirin Instructions: N/A  Anesthesia review: none  Patient denies shortness of breath, fever, cough and chest pain at PAT appointment   Patient verbalized understanding of instructions that were given to them at the PAT appointment. Patient was also instructed that they will need to review over the PAT instructions again at home before surgery.

## 2018-05-04 ENCOUNTER — Ambulatory Visit
Admission: RE | Admit: 2018-05-04 | Discharge: 2018-05-04 | Disposition: A | Discharge status: Home / Self Care | Payer: BC Managed Care – PPO | Source: Ambulatory Visit | Attending: General Surgery | Admitting: General Surgery

## 2018-05-04 DIAGNOSIS — N6091 Unspecified benign mammary dysplasia of right breast: Secondary | ICD-10-CM

## 2018-05-07 ENCOUNTER — Encounter (HOSPITAL_COMMUNITY)
Admission: RE | Disposition: A | Discharge status: Home / Self Care | Payer: Self-pay | Source: Ambulatory Visit | Attending: General Surgery

## 2018-05-07 ENCOUNTER — Ambulatory Visit (HOSPITAL_COMMUNITY): Payer: BC Managed Care – PPO | Admitting: Anesthesiology

## 2018-05-07 ENCOUNTER — Ambulatory Visit
Admission: RE | Admit: 2018-05-07 | Discharge: 2018-05-07 | Disposition: A | Discharge status: Home / Self Care | Payer: BC Managed Care – PPO | Source: Ambulatory Visit | Attending: General Surgery | Admitting: General Surgery

## 2018-05-07 ENCOUNTER — Other Ambulatory Visit: Payer: Self-pay

## 2018-05-07 ENCOUNTER — Ambulatory Visit (HOSPITAL_COMMUNITY)
Admission: RE | Admit: 2018-05-07 | Discharge: 2018-05-07 | Disposition: A | Discharge status: Home / Self Care | Payer: BC Managed Care – PPO | Source: Ambulatory Visit | Attending: General Surgery | Admitting: General Surgery

## 2018-05-07 ENCOUNTER — Encounter (HOSPITAL_COMMUNITY): Payer: Self-pay | Admitting: Certified Registered Nurse Anesthetist

## 2018-05-07 DIAGNOSIS — G40909 Epilepsy, unspecified, not intractable, without status epilepticus: Secondary | ICD-10-CM | POA: Insufficient documentation

## 2018-05-07 DIAGNOSIS — N6091 Unspecified benign mammary dysplasia of right breast: Secondary | ICD-10-CM | POA: Diagnosis present

## 2018-05-07 DIAGNOSIS — Z79899 Other long term (current) drug therapy: Secondary | ICD-10-CM | POA: Insufficient documentation

## 2018-05-07 HISTORY — PX: RADIOACTIVE SEED GUIDED EXCISIONAL BREAST BIOPSY: SHX6490

## 2018-05-07 LAB — POCT PREGNANCY, URINE: PREG TEST UR: NEGATIVE

## 2018-05-07 SURGERY — RADIOACTIVE SEED GUIDED BREAST BIOPSY
Anesthesia: Regional | Site: Breast | Laterality: Right

## 2018-05-07 MED ORDER — PROPOFOL 10 MG/ML IV BOLUS
INTRAVENOUS | Status: AC
  Filled 2018-05-07: qty 40

## 2018-05-07 MED ORDER — FENTANYL CITRATE (PF) 100 MCG/2ML IJ SOLN: 25.0000 ug | INTRAMUSCULAR | Status: DC | PRN

## 2018-05-07 MED ORDER — CEFAZOLIN SODIUM-DEXTROSE 2-4 GM/100ML-% IV SOLN
2.0000 g | INTRAVENOUS | Status: AC
  Administered 2018-05-07: 2 g via INTRAVENOUS

## 2018-05-07 MED ORDER — ACETAMINOPHEN 325 MG PO TABS: 650.0000 mg | ORAL_TABLET | ORAL | Status: DC | PRN

## 2018-05-07 MED ORDER — ONDANSETRON HCL 4 MG/2ML IJ SOLN
INTRAMUSCULAR | Status: DC | PRN
  Administered 2018-05-07: 4 mg via INTRAVENOUS

## 2018-05-07 MED ORDER — FENTANYL CITRATE (PF) 100 MCG/2ML IJ SOLN
INTRAMUSCULAR | Status: DC | PRN
  Administered 2018-05-07: 25 ug via INTRAVENOUS

## 2018-05-07 MED ORDER — BUPIVACAINE-EPINEPHRINE (PF) 0.25% -1:200000 IJ SOLN
INTRAMUSCULAR | Status: AC
  Filled 2018-05-07: qty 30

## 2018-05-07 MED ORDER — PROPOFOL 10 MG/ML IV BOLUS
INTRAVENOUS | Status: DC | PRN
  Administered 2018-05-07: 30 mg via INTRAVENOUS
  Administered 2018-05-07: 100 mg via INTRAVENOUS

## 2018-05-07 MED ORDER — ACETAMINOPHEN 500 MG PO TABS
ORAL_TABLET | ORAL | Status: AC
  Administered 2018-05-07: 1000 mg via ORAL
  Filled 2018-05-07: qty 2

## 2018-05-07 MED ORDER — SODIUM CHLORIDE 0.9 % IV SOLN: INTRAVENOUS | Status: DC

## 2018-05-07 MED ORDER — SODIUM CHLORIDE 0.9 % IV SOLN: 250.0000 mL | INTRAVENOUS | Status: DC | PRN

## 2018-05-07 MED ORDER — BUPIVACAINE-EPINEPHRINE 0.25% -1:200000 IJ SOLN
INTRAMUSCULAR | Status: DC | PRN
  Administered 2018-05-07: 30 mL

## 2018-05-07 MED ORDER — TRAMADOL HCL 50 MG PO TABS: 50.0000 mg | ORAL_TABLET | Freq: Four times a day (QID) | ORAL | 0 refills | Status: DC | PRN

## 2018-05-07 MED ORDER — MIDAZOLAM HCL 5 MG/5ML IJ SOLN
INTRAMUSCULAR | Status: DC | PRN
  Administered 2018-05-07: 1 mg via INTRAVENOUS

## 2018-05-07 MED ORDER — SODIUM CHLORIDE 0.9% FLUSH: 3.0000 mL | Freq: Two times a day (BID) | INTRAVENOUS | Status: DC

## 2018-05-07 MED ORDER — CELECOXIB 200 MG PO CAPS
ORAL_CAPSULE | ORAL | Status: AC
  Administered 2018-05-07: 200 mg
  Filled 2018-05-07: qty 1

## 2018-05-07 MED ORDER — GABAPENTIN 300 MG PO CAPS
ORAL_CAPSULE | ORAL | Status: AC
  Filled 2018-05-07: qty 1

## 2018-05-07 MED ORDER — 0.9 % SODIUM CHLORIDE (POUR BTL) OPTIME
TOPICAL | Status: DC | PRN
  Administered 2018-05-07: 1000 mL

## 2018-05-07 MED ORDER — SODIUM CHLORIDE 0.9% FLUSH: 3.0000 mL | INTRAVENOUS | Status: DC | PRN

## 2018-05-07 MED ORDER — ACETAMINOPHEN 650 MG RE SUPP: 650.0000 mg | RECTAL | Status: DC | PRN

## 2018-05-07 MED ORDER — FENTANYL CITRATE (PF) 250 MCG/5ML IJ SOLN
INTRAMUSCULAR | Status: AC
  Filled 2018-05-07: qty 5

## 2018-05-07 MED ORDER — CEFAZOLIN SODIUM-DEXTROSE 2-4 GM/100ML-% IV SOLN
INTRAVENOUS | Status: AC
  Filled 2018-05-07: qty 100

## 2018-05-07 MED ORDER — KETOROLAC TROMETHAMINE 15 MG/ML IJ SOLN
15.0000 mg | INTRAMUSCULAR | Status: AC
  Administered 2018-05-07: 15 mg via INTRAVENOUS

## 2018-05-07 MED ORDER — KETOROLAC TROMETHAMINE 15 MG/ML IJ SOLN
INTRAMUSCULAR | Status: AC
  Administered 2018-05-07: 15 mg via INTRAVENOUS
  Filled 2018-05-07: qty 1

## 2018-05-07 MED ORDER — OXYCODONE HCL 5 MG PO TABS: 5.0000 mg | ORAL_TABLET | ORAL | Status: DC | PRN

## 2018-05-07 MED ORDER — ENSURE PRE-SURGERY PO LIQD
296.0000 mL | Freq: Once | ORAL | Status: DC
  Filled 2018-05-07: qty 296

## 2018-05-07 MED ORDER — DEXAMETHASONE SODIUM PHOSPHATE 4 MG/ML IJ SOLN
INTRAMUSCULAR | Status: DC | PRN
  Administered 2018-05-07: 10 mg via INTRAVENOUS

## 2018-05-07 MED ORDER — ACETAMINOPHEN 500 MG PO TABS
1000.0000 mg | ORAL_TABLET | ORAL | Status: AC
  Administered 2018-05-07: 1000 mg via ORAL

## 2018-05-07 MED ORDER — ROPIVACAINE HCL 5 MG/ML IJ SOLN
INTRAMUSCULAR | Status: DC | PRN
  Administered 2018-05-07: 30 mL via PERINEURAL

## 2018-05-07 MED ORDER — MIDAZOLAM HCL 2 MG/2ML IJ SOLN
INTRAMUSCULAR | Status: AC
  Filled 2018-05-07: qty 2

## 2018-05-07 MED ORDER — LIDOCAINE HCL (CARDIAC) PF 100 MG/5ML IV SOSY
PREFILLED_SYRINGE | INTRAVENOUS | Status: DC | PRN
  Administered 2018-05-07: 80 mg via INTRAVENOUS

## 2018-05-07 MED ORDER — GABAPENTIN 100 MG PO CAPS
100.0000 mg | ORAL_CAPSULE | ORAL | Status: AC
  Administered 2018-05-07: 100 mg via ORAL
  Filled 2018-05-07: qty 1

## 2018-05-07 MED ORDER — LACTATED RINGERS IV SOLN
INTRAVENOUS | Status: DC
  Administered 2018-05-07: 12:00:00 via INTRAVENOUS

## 2018-05-07 SURGICAL SUPPLY — 58 items
ADH SKN CLS APL DERMABOND .7 (GAUZE/BANDAGES/DRESSINGS) ×1
APPLIER CLIP 9.375 MED OPEN (MISCELLANEOUS)
APR CLP MED 9.3 20 MLT OPN (MISCELLANEOUS)
BINDER BREAST LRG (GAUZE/BANDAGES/DRESSINGS) ×2 IMPLANT
BINDER BREAST XLRG (GAUZE/BANDAGES/DRESSINGS) IMPLANT
BLADE SURG 10 STRL SS (BLADE) ×3 IMPLANT
BLADE SURG 15 STRL LF DISP TIS (BLADE) ×1 IMPLANT
BLADE SURG 15 STRL SS (BLADE) ×3
CANISTER SUCT 3000ML PPV (MISCELLANEOUS) ×2 IMPLANT
CHLORAPREP W/TINT 26ML (MISCELLANEOUS) ×3 IMPLANT
CLIP APPLIE 9.375 MED OPEN (MISCELLANEOUS) IMPLANT
CLOSURE STERI-STRIP 1/2X4 (GAUZE/BANDAGES/DRESSINGS) ×1
CLOSURE WOUND 1/2 X4 (GAUZE/BANDAGES/DRESSINGS) ×1
CLSR STERI-STRIP ANTIMIC 1/2X4 (GAUZE/BANDAGES/DRESSINGS) ×1 IMPLANT
COVER PROBE W GEL 5X96 (DRAPES) ×3 IMPLANT
COVER SURGICAL LIGHT HANDLE (MISCELLANEOUS) ×3 IMPLANT
COVER WAND RF STERILE (DRAPES) ×3 IMPLANT
DERMABOND ADVANCED (GAUZE/BANDAGES/DRESSINGS) ×2
DERMABOND ADVANCED .7 DNX12 (GAUZE/BANDAGES/DRESSINGS) ×1 IMPLANT
DEVICE DUBIN SPECIMEN MAMMOGRA (MISCELLANEOUS) ×3 IMPLANT
DRAPE CHEST BREAST 15X10 FENES (DRAPES) ×3 IMPLANT
DRAPE UTILITY XL STRL (DRAPES) ×3 IMPLANT
ELECT COATED BLADE 2.86 ST (ELECTRODE) ×3 IMPLANT
ELECT REM PT RETURN 9FT ADLT (ELECTROSURGICAL) ×3
ELECTRODE REM PT RTRN 9FT ADLT (ELECTROSURGICAL) ×1 IMPLANT
GAUZE 4X4 16PLY RFD (DISPOSABLE) ×2 IMPLANT
GLOVE BIO SURGEON STRL SZ7 (GLOVE) ×3 IMPLANT
GLOVE BIOGEL PI IND STRL 7.5 (GLOVE) ×1 IMPLANT
GLOVE BIOGEL PI INDICATOR 7.5 (GLOVE) ×2
GOWN STRL REUS W/ TWL LRG LVL3 (GOWN DISPOSABLE) ×2 IMPLANT
GOWN STRL REUS W/TWL LRG LVL3 (GOWN DISPOSABLE) ×6
HEMOSTAT ARISTA ABSORB 3G PWDR (MISCELLANEOUS) IMPLANT
ILLUMINATOR WAVEGUIDE N/F (MISCELLANEOUS) IMPLANT
KIT BASIN OR (CUSTOM PROCEDURE TRAY) ×3 IMPLANT
KIT MARKER MARGIN INK (KITS) ×3 IMPLANT
LIGHT WAVEGUIDE WIDE FLAT (MISCELLANEOUS) IMPLANT
MARKER SKIN DUAL TIP RULER LAB (MISCELLANEOUS) ×3 IMPLANT
NDL HYPO 25GX1X1/2 BEV (NEEDLE) ×1 IMPLANT
NEEDLE HYPO 25GX1X1/2 BEV (NEEDLE) ×3 IMPLANT
NS IRRIG 1000ML POUR BTL (IV SOLUTION) ×2 IMPLANT
PACK SURGICAL SETUP 50X90 (CUSTOM PROCEDURE TRAY) ×3 IMPLANT
PENCIL BUTTON HOLSTER BLD 10FT (ELECTRODE) ×3 IMPLANT
SPONGE LAP 18X18 X RAY DECT (DISPOSABLE) ×1 IMPLANT
STRIP CLOSURE SKIN 1/2X4 (GAUZE/BANDAGES/DRESSINGS) ×2 IMPLANT
SUT MNCRL AB 4-0 PS2 18 (SUTURE) ×3 IMPLANT
SUT MON AB 5-0 PS2 18 (SUTURE) ×2 IMPLANT
SUT SILK 2 0 SH (SUTURE) IMPLANT
SUT VIC AB 2-0 SH 27 (SUTURE) ×3
SUT VIC AB 2-0 SH 27XBRD (SUTURE) ×1 IMPLANT
SUT VIC AB 3-0 SH 27 (SUTURE) ×3
SUT VIC AB 3-0 SH 27X BRD (SUTURE) ×1 IMPLANT
SYR BULB 3OZ (MISCELLANEOUS) ×3 IMPLANT
SYR CONTROL 10ML LL (SYRINGE) ×3 IMPLANT
TOWEL OR 17X24 6PK STRL BLUE (TOWEL DISPOSABLE) ×3 IMPLANT
TOWEL OR 17X26 10 PK STRL BLUE (TOWEL DISPOSABLE) ×3 IMPLANT
TUBE CONNECTING 12'X1/4 (SUCTIONS) ×1
TUBE CONNECTING 12X1/4 (SUCTIONS) ×1 IMPLANT
YANKAUER SUCT BULB TIP NO VENT (SUCTIONS) ×2 IMPLANT

## 2018-05-07 NOTE — Anesthesia Procedure Notes (Signed)
Anesthesia Regional Block: Pectoralis block   Pre-Anesthetic Checklist: ,, timeout performed, Correct Patient, Correct Site, Correct Laterality, Correct Procedure, Correct Position, site marked, Risks and benefits discussed,  Surgical consent,  Pre-op evaluation,  At surgeon's request and post-op pain management  Laterality: Right  Prep: Maximum Sterile Barrier Precautions used, chloraprep       Needles:  Injection technique: Single-shot  Needle Type: Echogenic Stimulator Needle     Needle Length: 9cm  Needle Gauge: 22     Additional Needles:   Procedures:,,,, ultrasound used (permanent image in chart),,,,  Narrative:  Start time: 05/07/2018 12:10 PM End time: 05/07/2018 12:20 PM Injection made incrementally with aspirations every 5 mL.  Performed by: Personally  Anesthesiologist: Elmer PickerWoodrum, Quincie Haroon L, MD  Additional Notes: Monitors applied. No increased pain on injection. No increased resistance to injection. Injection made in 5cc increments. Good needle visualization. Patient tolerated procedure well.

## 2018-05-07 NOTE — Discharge Instructions (Signed)
[facility] Office Phone Number [phone]  BREAST BIOPSY/ PARTIAL MASTECTOMY: POST OP INSTRUCTIONS Take 400 mg of ibuprofen every 8 hours or 650 mg tylenol every 6 hours for next 72 hours then as needed. Use ice several times daily also. Always review your discharge instruction sheet given to you by the facility where your surgery was performed.  IF YOU HAVE DISABILITY OR FAMILY LEAVE FORMS, YOU MUST BRING THEM TO THE OFFICE FOR PROCESSING.  DO NOT GIVE THEM TO YOUR DOCTOR.  1. A prescription for pain medication may be given to you upon discharge.  Take your pain medication as prescribed, if needed.  If narcotic pain medicine is not needed, then you may take acetaminophen (Tylenol), naprosyn (Alleve) or ibuprofen (Advil) as needed. 2. Take your usually prescribed medications unless otherwise directed 3. If you need a refill on your pain medication, please contact your pharmacy.  They will contact our office to request authorization.  Prescriptions will not be filled after 5pm or on week-ends. 4. You should eat very light the first 24 hours after surgery, such as soup, crackers, pudding, etc.  Resume your normal diet the day after surgery. 5. Most patients will experience some swelling and bruising in the breast.  Ice packs and a good support bra will help.  Wear the breast binder provided or a sports bra for 72 hours day and night.  After that wear a sports bra during the day until you return to the office. Swelling and bruising can take several days to resolve.  6. It is common to experience some constipation if taking pain medication after surgery.  Increasing fluid intake and taking a stool softener will usually help or prevent this problem from occurring.  A mild laxative (Milk of Magnesia or Miralax) should be taken according to package directions if there are no bowel movements after 48 hours. 7. Unless discharge instructions indicate otherwise, you may remove your bandages 48  hours after surgery and you may shower at that time.  You may have steri-strips (small skin tapes) in place directly over the incision.  These strips should be left on the skin for 7-10 days and will come off on their own.  If your surgeon used skin glue on the incision, you may shower in 24 hours.  The glue will flake off over the next 2-3 weeks.  Any sutures or staples will be removed at the office during your follow-up visit. 8. ACTIVITIES:  You may resume regular daily activities (gradually increasing) beginning the next day.  Wearing a good support bra or sports bra minimizes pain and swelling.  You may have sexual intercourse when it is comfortable. a. You may drive when you no longer are taking prescription pain medication, you can comfortably wear a seatbelt, and you can safely maneuver your car and apply brakes. b. RETURN TO WORK:  ______________________________________________________________________________________ 9. You should see your doctor in the office for a follow-up appointment approximately two weeks after your surgery.  Your doctor's nurse will typically make your follow-up appointment when she calls you with your pathology report.  Expect your pathology report 3-4 business days after your surgery.  You may call to check if you do not hear from us after three days. 10. OTHER INSTRUCTIONS: _______________________________________________________________________________________________ _____________________________________________________________________________________________________________________________________ _____________________________________________________________________________________________________________________________________ _____________________________________________________________________________________________________________________________________  WHEN TO CALL DR Tieler Cournoyer: 1. Fever over 101.0 2. Nausea and/or vomiting. 3. Extreme swelling or  bruising. 4. Continued bleeding from incision. 5. Increased pain, redness, or drainage from the incision.  The clinic   staff is available to answer your questions during regular business hours.  Please don't hesitate to call and ask to speak to one of the nurses for clinical concerns.  If you have a medical emergency, go to the nearest emergency room or call 911.  A surgeon from [facility] is always on call at the hospital.  For further questions, please visit [email or web address]  

## 2018-05-07 NOTE — Op Note (Signed)
Preoperative diagnosis: right breast ALH on core biopsy Postoperative diagnosis: Same as above Procedure: Right breast radioactive seed guided excisional biopsy Surgeon: Dr. Harden MoMatt Reiana Poteet Anesthesia: General Specimens: Right breast marked with paint containing seed and clip Estimated blood loss: Minimal Complications: None Drains: None Special count was correct at completion Disposition to recovery in stable condition  Indications: This a 47 year old female who has a small area of calcifications in the right breast.  Core biopsy shows atypical lobular hyperplasia.  I recommended just following this but she was interested in having it excised.  We discussed a seed guided excisional biopsy.  Procedure: After informed consent was obtained the patient first was given antibiotics.  SCDs were placed.  She had had a radioactive seed placed prior to beginning.  I had these mammograms available in the operating room.  She was then placed under general anesthesia without complication.  Her breast was prepped and draped in the standard sterile surgical fashion.  Surgical timeout was then performed.  I infiltrated Marcaine in the periareolar region.  I then made a periareolar incision in order to hide the scar later.  I used the neoprobe to guide me to the radioactive seed and I remove the seed and the surrounding tissue.  Mammogram confirmed removal of the seed as well as the clip.  Hemostasis was observed.  I closed this with 2-0 Vicryl, 3-0 Vicryl, and 5-0 Monocryl.  Glue and Steri-Strips were applied.  She tolerated this well was extubated and transferred to recovery stable.

## 2018-05-07 NOTE — Anesthesia Procedure Notes (Signed)
Procedure Name: LMA Insertion Date/Time: 05/07/2018 12:35 PM Performed by: Jed LimerickHarder, Bryttani Blew S, CRNA Pre-anesthesia Checklist: Patient identified, Emergency Drugs available, Suction available and Patient being monitored Patient Re-evaluated:Patient Re-evaluated prior to induction Oxygen Delivery Method: Circle System Utilized Preoxygenation: Pre-oxygenation with 100% oxygen Induction Type: IV induction Ventilation: Mask ventilation without difficulty LMA: LMA inserted LMA Size: 4.0 Number of attempts: 1 Placement Confirmation: positive ETCO2 Tube secured with: Tape Dental Injury: Teeth and Oropharynx as per pre-operative assessment

## 2018-05-07 NOTE — Anesthesia Preprocedure Evaluation (Addendum)
Anesthesia Evaluation  Patient identified by MRN, date of birth, ID band Patient awake    Reviewed: Allergy & Precautions, NPO status , Patient's Chart, lab work & pertinent test results  Airway Mallampati: III  TM Distance: >3 FB Neck ROM: Full    Dental no notable dental hx. (+) Teeth Intact, Dental Advisory Given   Pulmonary asthma ,    Pulmonary exam normal breath sounds clear to auscultation       Cardiovascular negative cardio ROS Normal cardiovascular exam Rhythm:Regular Rate:Normal     Neuro/Psych Seizures - (on carbamazepine and keppra, took both as prescribed),  epilepsy negative psych ROS   GI/Hepatic negative GI ROS, Neg liver ROS,   Endo/Other  negative endocrine ROS  Renal/GU negative Renal ROS  negative genitourinary   Musculoskeletal negative musculoskeletal ROS (+)   Abdominal   Peds  Hematology  (+) Blood dyscrasia, anemia ,   Anesthesia Other Findings Right breast ALH  Reproductive/Obstetrics                            Anesthesia Physical Anesthesia Plan  ASA: III  Anesthesia Plan: General and Regional   Post-op Pain Management:  Regional for Post-op pain   Induction: Intravenous  PONV Risk Score and Plan: 3 and Ondansetron, Dexamethasone and Midazolam  Airway Management Planned: LMA  Additional Equipment:   Intra-op Plan:   Post-operative Plan: Extubation in OR  Informed Consent: I have reviewed the patients History and Physical, chart, labs and discussed the procedure including the risks, benefits and alternatives for the proposed anesthesia with the patient or authorized representative who has indicated his/her understanding and acceptance.   Dental advisory given  Plan Discussed with: CRNA  Anesthesia Plan Comments:         Anesthesia Quick Evaluation

## 2018-05-07 NOTE — Interval H&P Note (Signed)
History and Physical Interval Note:  05/07/2018 12:09 PM  Patricia Cooper  has presented today for surgery, with the diagnosis of RIGHT BREAST ALH  The various methods of treatment have been discussed with the patient and family. After consideration of risks, benefits and other options for treatment, the patient has consented to  Procedure(s): RADIOACTIVE SEED GUIDED EXCISIONAL BREAST BIOPSY (Right) as a surgical intervention .  The patient's history has been reviewed, patient examined, no change in status, stable for surgery.  I have reviewed the patient's chart and labs.  Questions were answered to the patient's satisfaction.     Emelia LoronMatthew Daishia Fetterly

## 2018-05-07 NOTE — Anesthesia Postprocedure Evaluation (Signed)
Anesthesia Post Note  Patient: Patricia Cooper  Procedure(s) Performed: RADIOACTIVE SEED GUIDED EXCISIONAL BREAST BIOPSY (Right Breast)     Patient location during evaluation: PACU Anesthesia Type: Regional and General Level of consciousness: awake and alert Pain management: pain level controlled Vital Signs Assessment: post-procedure vital signs reviewed and stable Respiratory status: spontaneous breathing, nonlabored ventilation, respiratory function stable and patient connected to nasal cannula oxygen Cardiovascular status: blood pressure returned to baseline and stable Postop Assessment: no apparent nausea or vomiting Anesthetic complications: no    Last Vitals:  Vitals:   05/07/18 1345 05/07/18 1400  BP: 108/72 111/72  Pulse: 62 (!) 59  Resp: 11 11  Temp:    SpO2: 100% 100%    Last Pain:  Vitals:   05/07/18 1345  TempSrc:   PainSc: Asleep                 Chelsey L Woodrum

## 2018-05-07 NOTE — H&P (Signed)
47 yof referred by Dr Marisa Sprinkles for right breast calcs. she has no fh and no prior breast history. she has epilepsy, last seizure was several years ago after a sinus surgery. she has no mass or dc. she had mm that shows c density breasts. she has right breast calcifications present. this is 6mm in right uoq. stereo biopsy done and result is small focus of alh. she is here with her husband to discuss options.   Past Surgical History  Breast Biopsy  Right. Oral Surgery   Diagnostic Studies History  Colonoscopy  never Mammogram  within last year Pap Smear  1-5 years ago  Allergies  Inderal *BETA BLOCKERS*  Allergies Reconciled   Medication History  Lyrica (100MG  Capsule, Oral) Active. TEGretol-XR (200MG  Tablet ER 12HR, 400mg  in am 600mg  in pm Oral) Active. levETIRAcetam (500MG  Tablet, Oral) Active. Fluticasone-Salmeterol (500-50MCG/DOSE Aero Pow Br Act, Inhalation) Active. Advair Diskus (500-50MCG/DOSE Aero Pow Br Act, Inhalation) Active. Albuterol Sulfate HFA (108 (90 Base)MCG/ACT Aerosol Soln, Inhalation) Active. Medications Reconciled  Social History  Alcohol use  Remotely quit alcohol use. Caffeine use  Carbonated beverages. No drug use  Tobacco use  Never smoker.  Family History  Hypertension  Father.  Pregnancy / Birth History  Age at menarche  12 years. Gravida  2 Maternal age  29-25 Para  2 Regular periods   Other Problems  Migraine Headache  Seizure Disorder    Review of Systems  HEENT Present- Seasonal Allergies and Wears glasses/contact lenses. Not Present- Earache, Hearing Loss, Hoarseness, Nose Bleed, Oral Ulcers, Ringing in the Ears, Sinus Pain, Sore Throat, Visual Disturbances and Yellow Eyes. Respiratory Not Present- Bloody sputum, Chronic Cough, Difficulty Breathing, Snoring and Wheezing. Breast Present- Breast Mass. Not Present- Breast Pain, Nipple Discharge and Skin Changes. Cardiovascular Not Present- Chest Pain,  Difficulty Breathing Lying Down, Leg Cramps, Palpitations, Rapid Heart Rate, Shortness of Breath and Swelling of Extremities. Female Genitourinary Not Present- Frequency, Nocturia, Painful Urination, Pelvic Pain and Urgency. Musculoskeletal Not Present- Back Pain, Joint Pain, Joint Stiffness, Muscle Pain, Muscle Weakness and Swelling of Extremities. Neurological Present- Headaches. Not Present- Decreased Memory, Fainting, Numbness, Seizures, Tingling, Tremor, Trouble walking and Weakness. Psychiatric Not Present- Anxiety, Bipolar, Change in Sleep Pattern, Depression, Fearful and Frequent crying. Endocrine Not Present- Cold Intolerance, Excessive Hunger, Hair Changes, Heat Intolerance, Hot flashes and New Diabetes. Hematology Not Present- Blood Thinners, Easy Bruising, Excessive bleeding, Gland problems, HIV and Persistent Infections.  Vitals  Weight: 148.4 lb Height: 64in Body Surface Area: 1.72 m Body Mass Index: 25.47 kg/m  Temp.: 97.33F  Pulse: 85 (Regular)  BP: 120/78 (Sitting, Left Arm, Standard) Physical Exam  General Mental Status-Alert. Head and Neck Trachea-midline. Thyroid Gland Characteristics - normal size and consistency. Eye Sclera/Conjunctiva - Bilateral-No scleral icterus. Chest and Lung Exam Chest and lung exam reveals -quiet, even and easy respiratory effort with no use of accessory muscles and on auscultation, normal breath sounds, no adventitious sounds and normal vocal resonance. Breast Nipples-No Discharge. Breast Lump-No Palpable Breast Mass. Cardiovascular Cardiovascular examination reveals -normal heart sounds, regular rate and rhythm with no murmurs. Abdomen Note: soft nt no hm Neurologic Neurologic evaluation reveals -alert and oriented x 3 with no impairment of recent or remote memory. Lymphatic Head & Neck General Head & Neck Lymphatics: Bilateral - Description - Normal. Axillary General Axillary Region: Bilateral -  Description - Normal. 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 I told her that current practice is to follow this due to such a low risk of upgrade and she has no other concerning exam or radiologic features. She does not want to do this and even with such a low risk of upgrade would very much like to excise this. will check with her neurologist first and then plan seed guided excision, surgery, risks and recovery all discussed.

## 2018-05-07 NOTE — Transfer of Care (Signed)
Immediate Anesthesia Transfer of Care Note  Patient: Patricia Cooper  Procedure(s) Performed: RADIOACTIVE SEED GUIDED EXCISIONAL BREAST BIOPSY (Right Breast)  Patient Location: PACU  Anesthesia Type:GA combined with regional for post-op pain  Level of Consciousness: drowsy  Airway & Oxygen Therapy: Patient Spontanous Breathing and Patient connected to nasal cannula oxygen  Post-op Assessment: Report given to RN and Post -op Vital signs reviewed and stable  Post vital signs: Reviewed and stable  Last Vitals:  Vitals Value Taken Time  BP 114/67 05/07/2018  1:13 PM  Temp    Pulse 77 05/07/2018  1:14 PM  Resp 13 05/07/2018  1:14 PM  SpO2 100 % 05/07/2018  1:14 PM  Vitals shown include unvalidated device data.  Last Pain:  Vitals:   05/07/18 1143  TempSrc:   PainSc: 0-No pain      Patients Stated Pain Goal: 3 (05/07/18 1143)  Complications: No apparent anesthesia complications

## 2018-05-08 ENCOUNTER — Encounter (HOSPITAL_COMMUNITY): Payer: Self-pay | Admitting: General Surgery

## 2018-07-30 ENCOUNTER — Encounter: Payer: Self-pay | Admitting: Emergency Medicine

## 2018-07-30 ENCOUNTER — Emergency Department: Payer: BC Managed Care – PPO

## 2018-07-30 ENCOUNTER — Other Ambulatory Visit: Payer: Self-pay

## 2018-07-30 ENCOUNTER — Emergency Department
Admission: EM | Admit: 2018-07-30 | Discharge: 2018-07-30 | Disposition: A | Discharge status: Home / Self Care | Payer: BC Managed Care – PPO | Attending: Emergency Medicine | Admitting: Emergency Medicine

## 2018-07-30 DIAGNOSIS — J45909 Unspecified asthma, uncomplicated: Secondary | ICD-10-CM | POA: Insufficient documentation

## 2018-07-30 DIAGNOSIS — G43909 Migraine, unspecified, not intractable, without status migrainosus: Secondary | ICD-10-CM | POA: Insufficient documentation

## 2018-07-30 DIAGNOSIS — Z79899 Other long term (current) drug therapy: Secondary | ICD-10-CM | POA: Diagnosis not present

## 2018-07-30 DIAGNOSIS — G43901 Migraine, unspecified, not intractable, with status migrainosus: Secondary | ICD-10-CM

## 2018-07-30 DIAGNOSIS — R51 Headache: Secondary | ICD-10-CM | POA: Diagnosis present

## 2018-07-30 LAB — CBC WITH DIFFERENTIAL/PLATELET
Abs Immature Granulocytes: 0.01 10*3/uL (ref 0.00–0.07)
BASOS ABS: 0 10*3/uL (ref 0.0–0.1)
BASOS PCT: 0 %
EOS PCT: 1 %
Eosinophils Absolute: 0 10*3/uL (ref 0.0–0.5)
HCT: 32.8 % — ABNORMAL LOW (ref 36.0–46.0)
HEMOGLOBIN: 10.7 g/dL — AB (ref 12.0–15.0)
Immature Granulocytes: 0 %
LYMPHS PCT: 24 %
Lymphs Abs: 1.2 10*3/uL (ref 0.7–4.0)
MCH: 29.7 pg (ref 26.0–34.0)
MCHC: 32.6 g/dL (ref 30.0–36.0)
MCV: 91.1 fL (ref 80.0–100.0)
MONO ABS: 0.7 10*3/uL (ref 0.1–1.0)
Monocytes Relative: 13 %
NEUTROS ABS: 3.1 10*3/uL (ref 1.7–7.7)
Neutrophils Relative %: 62 %
PLATELETS: 359 10*3/uL (ref 150–400)
RBC: 3.6 MIL/uL — AB (ref 3.87–5.11)
RDW: 14.7 % (ref 11.5–15.5)
WBC: 4.9 10*3/uL (ref 4.0–10.5)
nRBC: 0 % (ref 0.0–0.2)

## 2018-07-30 LAB — COMPREHENSIVE METABOLIC PANEL
ALBUMIN: 3.6 g/dL (ref 3.5–5.0)
ALT: 14 U/L (ref 0–44)
ANION GAP: 7 (ref 5–15)
AST: 21 U/L (ref 15–41)
Alkaline Phosphatase: 41 U/L (ref 38–126)
BUN: 13 mg/dL (ref 6–20)
CALCIUM: 8 mg/dL — AB (ref 8.9–10.3)
CHLORIDE: 98 mmol/L (ref 98–111)
CO2: 22 mmol/L (ref 22–32)
Creatinine, Ser: 0.5 mg/dL (ref 0.44–1.00)
GFR calc non Af Amer: >60 mL/min (ref >60)
GLUCOSE: 102 mg/dL — AB (ref 70–99)
POTASSIUM: 3.6 mmol/L (ref 3.5–5.1)
SODIUM: 127 mmol/L — AB (ref 135–145)
Total Bilirubin: 0.6 mg/dL (ref 0.3–1.2)
Total Protein: 6.4 g/dL — ABNORMAL LOW (ref 6.5–8.1)

## 2018-07-30 LAB — HCG, QUANTITATIVE, PREGNANCY

## 2018-07-30 LAB — TROPONIN I: Troponin I: <0.03 ng/mL (ref <0.03)

## 2018-07-30 MED ORDER — KETOROLAC TROMETHAMINE 30 MG/ML IJ SOLN: 15.0000 mg | Freq: Once | INTRAMUSCULAR | Status: DC

## 2018-07-30 MED ORDER — BUTALBITAL-APAP-CAFFEINE 50-325-40 MG PO TABS: 1.0000 | ORAL_TABLET | Freq: Four times a day (QID) | ORAL | 0 refills | Status: AC | PRN

## 2018-07-30 MED ORDER — KETOROLAC TROMETHAMINE 30 MG/ML IJ SOLN
15.0000 mg | Freq: Once | INTRAMUSCULAR | Status: AC
  Administered 2018-07-30: 15 mg via INTRAVENOUS
  Filled 2018-07-30: qty 1

## 2018-07-30 MED ORDER — SODIUM CHLORIDE 0.9 % IV BOLUS
1000.0000 mL | Freq: Once | INTRAVENOUS | Status: AC
  Administered 2018-07-30: 1000 mL via INTRAVENOUS

## 2018-07-30 MED ORDER — METOCLOPRAMIDE HCL 5 MG/ML IJ SOLN
10.0000 mg | Freq: Once | INTRAMUSCULAR | Status: AC
  Administered 2018-07-30: 10 mg via INTRAVENOUS
  Filled 2018-07-30: qty 2

## 2018-07-30 MED ORDER — ONDANSETRON 4 MG PO TBDP: 4.0000 mg | ORAL_TABLET | Freq: Three times a day (TID) | ORAL | 0 refills | Status: DC | PRN

## 2018-07-30 MED ORDER — DIPHENHYDRAMINE HCL 50 MG/ML IJ SOLN
50.0000 mg | Freq: Once | INTRAMUSCULAR | Status: AC
  Administered 2018-07-30: 50 mg via INTRAVENOUS
  Filled 2018-07-30: qty 1

## 2018-07-30 NOTE — ED Triage Notes (Signed)
Pt presents from home via acems with c/o migraine. Pt states she woke up this am with intense migraine. Pt has hx of migraines. Pt took migraine medication along with oxycodone at home. Pt states this usually relieves pain, but did not this time. Pt c/o 4/10 pain at this time. Pt family states that around 6pm it appeared as though pt passed out and would not respond to their questions. Pt currently lethargic, but oriented x4. Pt appears pale in bed. Pt family states color is abnormal for her. Blood sugar 115 for ems. 20G IV in left AC placed in route.

## 2018-07-30 NOTE — ED Provider Notes (Signed)
[facility] Emergency Department Provider Note  __________________________[facility]__________________  Time seen: Approximately 7:34 PM  I have reviewed the triage vital signs and the nursing notes.   HISTORY  Chief Complaint Migraine   HPI Patricia Cooper is a 48 y.o. female with a history of seizure disorder, migraine headaches, chronic sinusitis, asthma and anemia who presents for evaluation of a headache.  Patient reports that the headache is identical to her prior migraines.  She reports having a few episodes every year.  She woke up with this HA today. The headache is sharp, located in the left side frontal and occipital, associated with photophobia, nausea and dry heaving.  She took a total of 50 mg of tramadol and 5 of oxycodone throughout the day with some relief of the headache which is currently 4 out of 10.  This evening she told her husband that she felt dizzy like she was going to pass out which prompted him to call 911.  Patient denies any personal or family history of aneurysms. No thunderclap HA.   Past Medical History:  Diagnosis Date  . Anemia   . Asthma   . Epilepsy (HCC)   . LGSIL of cervix of undetermined significance 07/2017  . Seizure disorder (HCC)   . Sinusitis, chronic     Patient Active Problem List   Diagnosis Date Noted  . Epilepsy (HCC)   . Asthma     Past Surgical History:  Procedure Laterality Date  . NASAL SINUS SURGERY  03/2010,06-2013  . RADIOACTIVE SEED GUIDED EXCISIONAL BREAST BIOPSY Right 05/07/2018   Procedure: RADIOACTIVE SEED GUIDED EXCISIONAL BREAST BIOPSY;  Surgeon: Emelia LoronWakefield, Matthew, MD;  [facility];  Service: General;  Laterality: Right;  . TUBAL LIGATION     POST PARTUM  . WISDOM TOOTH EXTRACTION      Prior to Admission medications   Medication Sig Start Date End Date Taking? Authorizing Provider  albuterol (PROVENTIL HFA;VENTOLIN HFA) 108 (90 Base) MCG/ACT inhaler Inhale 2 puffs into the lungs every 6  (six) hours as needed for wheezing or shortness of breath.    [provider]  butalbital-acetaminophen-caffeine (FIORICET, ESGIC) 50-325-40 MG tablet Take 1-2 tablets by mouth every 6 (six) hours as needed. 07/30/18 07/30/19  Nita SickleVeronese, [place], MD  carbamazepine (TEGRETOL XR) 200 MG 12 hr tablet Take 400-600 mg by mouth See admin instructions. Take 400 mg by mouth in the morning and take 600 mg by mouth at bedtime    [provider]  cetirizine-pseudoephedrine (ZYRTEC-D) 5-120 MG tablet Take 1 tablet by mouth every Monday.    [provider]  EPINEPHrine (EPI-PEN) 0.3 mg/0.3 mL DEVI Inject 0.3 mg into the muscle once.      [provider]  Fluticasone-Salmeterol (ADVAIR) 500-50 MCG/DOSE AEPB Inhale 1 puff into the lungs 2 (two) times daily.  03/15/18   [provider]  levETIRAcetam (KEPPRA) 500 MG tablet Take 500 mg by mouth every 12 (twelve) hours.      [provider]  ondansetron (ZOFRAN ODT) 4 MG disintegrating tablet Take 1 tablet (4 mg total) by mouth every 8 (eight) hours as needed. 07/30/18   Nita SickleVeronese, [place], MD  pregabalin (LYRICA) 100 MG capsule Take 100 mg by mouth at bedtime.     [provider]  PRESCRIPTION MEDICATION See admin instructions. Allergy shots weekly on Monday    [provider]  traMADol (ULTRAM) 50 MG tablet Take 1 tablet (50 mg total) by mouth every 6 (six) hours as needed.  05/07/18   Emelia LoronWakefield, Matthew, MD    Allergies Shellfish allergy and Inderal [propranolol hcl]  Family History  Problem Relation Age of Onset  . Hypertension Father   . Rheum arthritis Maternal Grandmother   . Breast cancer Neg Hx     Social History Social History   Tobacco Use  . Smoking status: Never Smoker  . Smokeless tobacco: Never Used  Substance Use Topics  . Alcohol use: No    Alcohol/week: 0.0 standard drinks  . Drug use: No    Review of Systems  Constitutional: Negative for fever. + dizziness Eyes:  Negative for visual changes. ENT: Negative for sore throat. Neck: No neck pain  Cardiovascular: Negative for chest pain. Respiratory: Negative for shortness of breath. Gastrointestinal: Negative for abdominal pain, vomiting or diarrhea. + nausea Genitourinary: Negative for dysuria. Musculoskeletal: Negative for back pain. Skin: Negative for rash. Neurological: Negative for weakness or numbness. + HA, photophobia Psych: No SI or HI  ____________________________________________   PHYSICAL EXAM:  VITAL SIGNS: Vitals:   07/30/18 1933  BP: 116/77  Pulse: 61  Resp: 14  Temp: 97.6 F (36.4 C)  SpO2: 98%   Constitutional: Alert and oriented, looks pale and dry.  HEENT:      Head: Normocephalic and atraumatic.         Eyes: Conjunctivae are normal. Sclera is non-icteric. PERRL, EOMI      Mouth/Throat: Mucous membranes are moist.       Neck: Supple with no signs of meningismus. Cardiovascular: Regular rate and rhythm. No murmurs, gallops, or rubs. 2+ symmetrical distal pulses are present in all extremities. No JVD. Respiratory: Normal respiratory effort. Lungs are clear to auscultation bilaterally. No wheezes, crackles, or rhonchi.  Gastrointestinal: Soft, non tender, and non distended with positive bowel sounds. No rebound or guarding. Musculoskeletal: Nontender with normal range of motion in all extremities. No edema, cyanosis, or erythema of extremities. Neurologic: Normal speech and language. Face is symmetric. Moving all extremities. No gross focal neurologic deficits are appreciated. Skin: Skin is warm, dry and intact. No rash noted. Psychiatric: Mood and affect are normal. Speech and behavior are normal.  ____________________________________________   LABS (all labs ordered are listed, but only abnormal results are displayed)  Labs Reviewed  CBC WITH DIFFERENTIAL/PLATELET - Abnormal; Notable for the following components:      Result Value   RBC 3.60 (*)    Hemoglobin  10.7 (*)    HCT 32.8 (*)    All other components within normal limits  COMPREHENSIVE METABOLIC PANEL - Abnormal; Notable for the following components:   Sodium 127 (*)    Glucose, Bld 102 (*)    Calcium 8.0 (*)    Total Protein 6.4 (*)    All other components within normal limits  TROPONIN I  HCG, QUANTITATIVE, PREGNANCY  URINALYSIS, COMPLETE (UACMP) WITH MICROSCOPIC   ____________________________________________  EKG  ED ECG REPORT I, Nita Sicklearolina Jasier Calabretta, the attending physician, personally viewed and interpreted this ECG.  Sinus bradycardia, rate of 55, normal intervals, normal axis, no ST elevations or depressions.  Otherwise normal EKG. ____________________________________________  RADIOLOGY  I have personally reviewed the images performed during this visit and I agree with the Radiologist's read.   Interpretation by Radiologist:  Ct Head Wo Contrast  Result Date: 07/30/2018 CLINICAL DATA:  Headache EXAM: CT HEAD WITHOUT CONTRAST TECHNIQUE: Contiguous axial images were obtained from the base of the skull through the vertex without intravenous contrast. COMPARISON:  None. FINDINGS: Brain: No acute intracranial abnormality. Specifically,  no hemorrhage, hydrocephalus, mass lesion, acute infarction, or significant intracranial injury. Vascular: No hyperdense vessel or unexpected calcification. Skull: No acute calvarial abnormality. Sinuses/Orbits: No acute findings Other: None IMPRESSION: No acute intracranial abnormality. Electronically Signed   By: Charlett Nose M.D.   On: 07/30/2018 20:02      ____________________________________________   PROCEDURES  Procedure(s) performed: None Procedures Critical Care performed:  None ____________________________________________   INITIAL IMPRESSION / ASSESSMENT AND PLAN / ED COURSE  48 y.o. female with a history of seizure disorder, migraine headaches, chronic sinusitis, asthma and anemia who presents for evaluation of a headache.   Patient reports that the headaches identical to prior migraine headaches.  She did take tramadol and oxycodone for the headache which is currently 4 out of 10.  She is concerned because she feels dizzy like she is going to pass out.  She looks dry and pale on exam, looks unwell but in no apparent distress.  She is otherwise neurologically intact.  Will give IV fluids to help with dehydration, will check basic labs, will give a migraine cocktail.  We will do a CT head.  Clinical Course as of Jul 30 2114  Mon Jul 30, 2018  2114 Patient reports full resolution of her symptoms.  No longer dizzy, headache has resolved.  Labs show a slightly low sodium however patient has had low sodium in the past.  She has received a liter of normal saline.  Will discharge her home on Fioricet for her migraine headaches.  Discussed standard return precautions and close follow-up with her primary care doctor   [CV]    Clinical Course User Index [CV] Don Perking [place], MD     As part of my medical decision making, I reviewed the following data within the electronic MEDICAL RECORD NUMBER Nursing notes reviewed and incorporated, Labs reviewed , Old chart reviewed, Radiograph reviewed , Notes from prior ED visits and [place] Controlled Substance Database    Pertinent labs & imaging results that were available during my care of the patient were reviewed by me and considered in my medical decision making (see chart for details).    ____________________________________________   FINAL CLINICAL IMPRESSION(S) / ED DIAGNOSES  Final diagnoses:  Migraine with status migrainosus, not intractable, unspecified migraine type      NEW MEDICATIONS STARTED DURING THIS VISIT:  ED Discharge Orders         Ordered    butalbital-acetaminophen-caffeine (FIORICET, ESGIC) 50-325-40 MG tablet  Every 6 hours PRN     07/30/18 2115    ondansetron (ZOFRAN ODT) 4 MG disintegrating tablet  Every 8 hours PRN     07/30/18 2115            Note:  This document was prepared using Dragon voice recognition software and may include unintentional dictation errors.    Nita Sickle, MD 07/30/18 2116

## 2018-07-30 NOTE — ED Notes (Signed)
Pt went to CT

## 2018-09-27 HISTORY — PX: BREAST BIOPSY: SHX20

## 2018-10-24 ENCOUNTER — Other Ambulatory Visit: Payer: Self-pay

## 2018-10-24 ENCOUNTER — Telehealth: Payer: Self-pay | Admitting: *Deleted

## 2018-10-24 NOTE — Telephone Encounter (Signed)
Patient called and left message c/o heavy and irregular bleeding, asked what to do. I called back and received voicemail, I left message asking her to schedule a OV with provider.

## 2018-10-25 ENCOUNTER — Encounter: Payer: Self-pay | Admitting: Gynecology

## 2018-10-25 ENCOUNTER — Ambulatory Visit: Payer: BC Managed Care – PPO | Admitting: Gynecology

## 2018-10-25 VITALS — BP 118/74

## 2018-10-25 DIAGNOSIS — N926 Irregular menstruation, unspecified: Secondary | ICD-10-CM

## 2018-10-25 MED ORDER — MEDROXYPROGESTERONE ACETATE 10 MG PO TABS: 10.0000 mg | ORAL_TABLET | Freq: Every day | ORAL | 5 refills | Status: DC

## 2018-10-25 NOTE — Patient Instructions (Addendum)
Take the prescribed progesterone medication daily for 10 days.  Repeat this if you have not had a menses for 6 to 8 weeks.  Office will call you or contact you through MyChart with blood test results drawn today.

## 2018-10-25 NOTE — Progress Notes (Signed)
    CIONA MESS 1971-01-02 [id number]        48 y.o.  G2P2002 presents with continued irregular bleeding.  She was evaluated January 2019 due to skipped menses with irregular bleeding.  The sonohysterogram was negative and she had a negative endometrial biopsy showing proliferative endometrium.  She is continued to skip menses since and over the last month has bled on and off after several months of amenorrhea.  Not having significant hot flushes or sweats.  Past medical history,surgical history, problem list, medications, allergies, family history and social history were all reviewed and documented in the EPIC chart.  Directed ROS with pertinent positives and negatives documented in the history of present illness/assessment and plan.  Exam: [name] assistant Vitals:   10/25/18 1221  BP: 118/74   General appearance:  Normal Abdomen soft nontender without masses guarding rebound Pelvic external BUS vagina with scant bleeding.  Cervix normal.  Uterus normal size midline mobile nontender.  Adnexa without masses or tenderness.  Assessment/Plan:  48 y.o. G5O0370 with history of irregular bleeding of the past year or so.  Prior work-up 06/2017 was negative.  We discussed whether the prior work-up is predictive of current situation or whether to repeat the sonohysterogram to rule out significant pathology such as precancerous or cancer.  Most likely her irregular bleeding reflects ovulatory irregularity/perimenopausal changes.  Alternative option would be to treat with intermittent progesterone for now to have a more predictable withdrawal and prevent endometrial buildup causing heavier menses and prolonged bleeding.  Issues of missed pathology if we do not proceed with sonohysterogram/biopsy now discussed.  At this point the patient is comfortable with not repeating the ultrasound or biopsy.  We will plan on progesterone withdrawal now and every 6 to 8 weeks if without menses.  If she regulates  with this then will follow.  Provera 10 mg x 10 days with 5 refills provided.  If irregular bleeding continue she will call and we will pursue a more involved evaluation.  Will check baseline CBC due to her bleeding and a FSH to see where we stand from a menopausal standpoint.    Dara Lords MD, 12:43 PM 10/25/2018

## 2018-10-26 LAB — CBC WITH DIFFERENTIAL/PLATELET
Absolute Monocytes: 647 cells/uL (ref 200–950)
Basophils Absolute: 31 cells/uL (ref 0–200)
Basophils Relative: 0.5 %
Eosinophils Absolute: 43 cells/uL (ref 15–500)
Eosinophils Relative: 0.7 %
HCT: 31.8 % — ABNORMAL LOW (ref 35.0–45.0)
Hemoglobin: 10.3 g/dL — ABNORMAL LOW (ref 11.7–15.5)
Lymphs Abs: 1122 cells/uL (ref 850–3900)
MCH: 29.9 pg (ref 27.0–33.0)
MCHC: 32.4 g/dL (ref 32.0–36.0)
MCV: 92.2 fL (ref 80.0–100.0)
MPV: 10.2 fL (ref 7.5–12.5)
Monocytes Relative: 10.6 %
Neutro Abs: 4258 cells/uL (ref 1500–7800)
Neutrophils Relative %: 69.8 %
Platelets: 322 10*3/uL (ref 140–400)
RBC: 3.45 10*6/uL — ABNORMAL LOW (ref 3.80–5.10)
RDW: 13.5 % (ref 11.0–15.0)
Total Lymphocyte: 18.4 %
WBC: 6.1 10*3/uL (ref 3.8–10.8)

## 2018-10-26 LAB — FOLLICLE STIMULATING HORMONE: FSH: 7.9 m[IU]/mL

## 2018-10-29 ENCOUNTER — Encounter: Payer: Self-pay | Admitting: Gynecology

## 2018-11-05 ENCOUNTER — Telehealth: Payer: Self-pay | Admitting: *Deleted

## 2018-11-05 MED ORDER — MEGESTROL ACETATE 20 MG PO TABS: ORAL_TABLET | ORAL | 0 refills | Status: DC

## 2018-11-05 NOTE — Telephone Encounter (Signed)
Megace 20 mg twice daily until bleeding slows significantly then daily x14 days then stop.  Dispense #30.  Follow-up if irregular bleeding continues.

## 2018-11-05 NOTE — Telephone Encounter (Signed)
Patient called to update from OV on 10/25/18, states she took the provera 10 mg and the bleeding did slow down, never stopped, bleeding is back to "full force now" changing every 1 1/2 hours. Patient asked what to do now? Please advise

## 2018-11-05 NOTE — Telephone Encounter (Signed)
Patient informed, Rx sent.  

## 2018-11-09 ENCOUNTER — Other Ambulatory Visit: Payer: Self-pay | Admitting: General Surgery

## 2018-11-09 DIAGNOSIS — Z1231 Encounter for screening mammogram for malignant neoplasm of breast: Secondary | ICD-10-CM

## 2018-11-13 ENCOUNTER — Telehealth: Payer: Self-pay

## 2018-11-13 NOTE — Telephone Encounter (Signed)
Dr. Audie Box sent My Chart email that was returned unread today, I called patient and per DPR access note on file I left detailed message in her voice mail reading her Dr. Kristie Cowman note:  "Your blood count is a touch low. I would take a multivitamin with iron to help supplement this. Your hormone level was normal which means we are not into menopause yet. I would go with the progesterone medication intermittently to help regulate the bleeding as we discussed.  If you have any questions call me."

## 2018-11-19 DIAGNOSIS — R87610 Atypical squamous cells of undetermined significance on cytologic smear of cervix (ASC-US): Secondary | ICD-10-CM

## 2018-11-19 HISTORY — DX: Atypical squamous cells of undetermined significance on cytologic smear of cervix (ASC-US): R87.610

## 2018-12-14 ENCOUNTER — Other Ambulatory Visit: Payer: Self-pay

## 2018-12-17 ENCOUNTER — Ambulatory Visit: Payer: BC Managed Care – PPO | Admitting: Gynecology

## 2018-12-17 ENCOUNTER — Encounter: Payer: Self-pay | Admitting: Gynecology

## 2018-12-17 ENCOUNTER — Other Ambulatory Visit: Payer: Self-pay

## 2018-12-17 VITALS — BP 116/74 | Ht 64.0 in | Wt 134.0 lb

## 2018-12-17 DIAGNOSIS — Z01419 Encounter for gynecological examination (general) (routine) without abnormal findings: Secondary | ICD-10-CM

## 2018-12-17 DIAGNOSIS — R87612 Low grade squamous intraepithelial lesion on cytologic smear of cervix (LGSIL): Secondary | ICD-10-CM | POA: Diagnosis not present

## 2018-12-17 DIAGNOSIS — Z13220 Encounter for screening for lipoid disorders: Secondary | ICD-10-CM

## 2018-12-17 DIAGNOSIS — N926 Irregular menstruation, unspecified: Secondary | ICD-10-CM | POA: Diagnosis not present

## 2018-12-17 MED ORDER — MEDROXYPROGESTERONE ACETATE 10 MG PO TABS: 10.0000 mg | ORAL_TABLET | Freq: Two times a day (BID) | ORAL | 5 refills | Status: DC

## 2018-12-17 NOTE — Patient Instructions (Signed)
Take the progesterone pill daily for 10 days to bring on a period.  Do this every other to every third month if you are without a regular period to prevent buildup of the endometrial lining.  Follow-up in 1 year for annual exam

## 2018-12-17 NOTE — Addendum Note (Signed)
Addended by: Nelva Nay on: 12/17/2018 12:26 PM   Modules accepted: Orders

## 2018-12-17 NOTE — Progress Notes (Signed)
    Patricia Cooper March 27, 1971 [id number]        47 y.o.  Y4M2500 for annual gynecologic exam.  Recently evaluated for irregular bleeding.  Last bleeding episode over a month ago.  We discussed using intermittent progesterone withdrawal if she goes more than 2 to 3 months without menses.  Work-up included a negative sonohysterogram and a normal FSH for her irregular bleeding.  Last Pap smear 2018 was normal with negative HPV.  She had a bulbous area of her cervix excised last year which showed cervical tissue but also LGSIL.  We recommended she follow-up with Pap smear in 1 year.  Past medical history,surgical history, problem list, medications, allergies, family history and social history were all reviewed and documented as reviewed in the EPIC chart.  ROS:  Performed with pertinent positives and negatives included in the history, assessment and plan.   Additional significant findings : None   Exam: Patricia Cooper assistant Vitals:   12/17/18 1116  BP: 116/74  Weight: 134 lb (60.8 kg)  Height: 5\' 4"  (1.626 m)   Body mass index is 23 kg/m.  General appearance:  Normal affect, orientation and appearance. Skin: Grossly normal HEENT: Without gross lesions.  No cervical or supraclavicular adenopathy. Thyroid normal.  Lungs:  Clear without wheezing, rales or rhonchi Cardiac: RR, without RMG Abdominal:  Soft, nontender, without masses, guarding, rebound, organomegaly or hernia Breasts:  Examined lying and sitting without masses, retractions, discharge or axillary adenopathy. Pelvic:  Ext, BUS, Vagina: Normal  Cervix: Normal.  Pap smear done  Uterus: Anteverted, normal size, shape and contour, midline and mobile nontender   Adnexa: Without masses or tenderness    Anus and perineum: Normal   Rectovaginal: Normal sphincter tone without palpated masses or tenderness.    Assessment/Plan:  48 y.o. G28P2002 female for annual gynecologic exam.  BTL contraception  1. History of irregular menses.   No bleeding since the beginning of May.  Recommended she go ahead and take a Provera 10 mg x 10-day withdrawal now.  Refill x5 provided.  She will uses every other to every third month if without menses.  We discussed anovulation and the risk of hyperplasia if goes too long without withdrawal as well as higher risk of irregular heavy bleeding.  She will go ahead and do this for this coming year and follow-up if any significant irregular bleeding. 2. Mammography 02/2018.  Continue with annual mammography this fall.  Breast exam normal today. 3. Pap smear 05/2017 with HPV was normal.  Cervical biopsy last year showed LGSIL.  Pap smear done today.  If normal then plan repeat Pap smear next year and if continues normal then go to less frequent screening intervals.  If abnormal then will triage based upon results. 4. Health maintenance.  History of low-grade anemia.  Has appointment to have blood count rechecked next week at her primary physician's office.  She is going to cancel this and we will go ahead and check a CBC today along with a CMP and lipid profile.  Follow-up 1 year, sooner as needed.   Patricia Auerbach MD, 11:51 AM 12/17/2018

## 2018-12-18 ENCOUNTER — Encounter: Payer: Self-pay | Admitting: Gynecology

## 2018-12-18 LAB — CBC WITH DIFFERENTIAL/PLATELET
Absolute Monocytes: 505 cells/uL (ref 200–950)
Basophils Absolute: 19 cells/uL (ref 0–200)
Basophils Relative: 0.5 %
Eosinophils Absolute: 30 cells/uL (ref 15–500)
Eosinophils Relative: 0.8 %
HCT: 33.6 % — ABNORMAL LOW (ref 35.0–45.0)
Hemoglobin: 10.6 g/dL — ABNORMAL LOW (ref 11.7–15.5)
Lymphs Abs: 1030 cells/uL (ref 850–3900)
MCH: 29 pg (ref 27.0–33.0)
MCHC: 31.5 g/dL — ABNORMAL LOW (ref 32.0–36.0)
MCV: 91.8 fL (ref 80.0–100.0)
MPV: 10.5 fL (ref 7.5–12.5)
Monocytes Relative: 13.3 %
Neutro Abs: 2215 cells/uL (ref 1500–7800)
Neutrophils Relative %: 58.3 %
Platelets: 337 10*3/uL (ref 140–400)
RBC: 3.66 10*6/uL — ABNORMAL LOW (ref 3.80–5.10)
RDW: 12.6 % (ref 11.0–15.0)
Total Lymphocyte: 27.1 %
WBC: 3.8 10*3/uL (ref 3.8–10.8)

## 2018-12-18 LAB — COMPREHENSIVE METABOLIC PANEL
AG Ratio: 1.7 (calc) (ref 1.0–2.5)
ALT: 18 U/L (ref 6–29)
AST: 18 U/L (ref 10–35)
Albumin: 4.1 g/dL (ref 3.6–5.1)
Alkaline phosphatase (APISO): 47 U/L (ref 31–125)
BUN: 12 mg/dL (ref 7–25)
CO2: 24 mmol/L (ref 20–32)
Calcium: 8.9 mg/dL (ref 8.6–10.2)
Chloride: 102 mmol/L (ref 98–110)
Creat: 0.65 mg/dL (ref 0.50–1.10)
Globulin: 2.4 g/dL (calc) (ref 1.9–3.7)
Glucose, Bld: 88 mg/dL (ref 65–99)
Potassium: 4.5 mmol/L (ref 3.5–5.3)
Sodium: 133 mmol/L — ABNORMAL LOW (ref 135–146)
Total Bilirubin: 0.4 mg/dL (ref 0.2–1.2)
Total Protein: 6.5 g/dL (ref 6.1–8.1)

## 2018-12-18 LAB — LIPID PANEL
Cholesterol: 241 mg/dL — ABNORMAL HIGH (ref <200)
HDL: 82 mg/dL (ref >=50)
LDL Cholesterol (Calc): 142 mg/dL (calc) — ABNORMAL HIGH
Non-HDL Cholesterol (Calc): 159 mg/dL (calc) — ABNORMAL HIGH (ref <130)
Total CHOL/HDL Ratio: 2.9 (calc) (ref <5.0)
Triglycerides: 76 mg/dL (ref <150)

## 2018-12-19 LAB — PAP IG W/ RFLX HPV ASCU

## 2018-12-19 LAB — HUMAN PAPILLOMAVIRUS, HIGH RISK: HPV DNA High Risk: NOT DETECTED

## 2018-12-20 ENCOUNTER — Encounter: Payer: Self-pay | Admitting: Gynecology

## 2019-01-01 NOTE — Telephone Encounter (Signed)
My chart message came back unread, patient informed, will have labs done at PCP.

## 2019-01-03 NOTE — Telephone Encounter (Signed)
Spoke with patient and informed her of this result note. Recall placed.

## 2019-02-26 ENCOUNTER — Other Ambulatory Visit: Payer: Self-pay

## 2019-02-26 ENCOUNTER — Ambulatory Visit
Admission: RE | Admit: 2019-02-26 | Discharge: 2019-02-26 | Disposition: A | Discharge status: Home / Self Care | Payer: BC Managed Care – PPO | Source: Ambulatory Visit | Attending: General Surgery | Admitting: General Surgery

## 2019-02-26 DIAGNOSIS — Z1231 Encounter for screening mammogram for malignant neoplasm of breast: Secondary | ICD-10-CM

## 2019-02-28 ENCOUNTER — Other Ambulatory Visit: Payer: Self-pay

## 2019-02-28 ENCOUNTER — Emergency Department: Payer: BC Managed Care – PPO

## 2019-02-28 ENCOUNTER — Emergency Department
Admission: EM | Admit: 2019-02-28 | Discharge: 2019-02-28 | Disposition: A | Discharge status: Home / Self Care | Payer: BC Managed Care – PPO | Attending: Emergency Medicine | Admitting: Emergency Medicine

## 2019-02-28 DIAGNOSIS — W109XXA Fall (on) (from) unspecified stairs and steps, initial encounter: Secondary | ICD-10-CM | POA: Diagnosis not present

## 2019-02-28 DIAGNOSIS — Y999 Unspecified external cause status: Secondary | ICD-10-CM | POA: Insufficient documentation

## 2019-02-28 DIAGNOSIS — J45909 Unspecified asthma, uncomplicated: Secondary | ICD-10-CM | POA: Diagnosis not present

## 2019-02-28 DIAGNOSIS — Y939 Activity, unspecified: Secondary | ICD-10-CM | POA: Insufficient documentation

## 2019-02-28 DIAGNOSIS — S299XXA Unspecified injury of thorax, initial encounter: Secondary | ICD-10-CM | POA: Diagnosis present

## 2019-02-28 DIAGNOSIS — W19XXXA Unspecified fall, initial encounter: Secondary | ICD-10-CM

## 2019-02-28 DIAGNOSIS — Y929 Unspecified place or not applicable: Secondary | ICD-10-CM | POA: Insufficient documentation

## 2019-02-28 DIAGNOSIS — S3210XA Unspecified fracture of sacrum, initial encounter for closed fracture: Secondary | ICD-10-CM | POA: Diagnosis not present

## 2019-02-28 DIAGNOSIS — S22049A Unspecified fracture of fourth thoracic vertebra, initial encounter for closed fracture: Secondary | ICD-10-CM | POA: Insufficient documentation

## 2019-02-28 LAB — CBC WITH DIFFERENTIAL/PLATELET
Abs Immature Granulocytes: 0.07 10*3/uL (ref 0.00–0.07)
Basophils Absolute: 0 10*3/uL (ref 0.0–0.1)
Basophils Relative: 0 %
Eosinophils Absolute: 0 10*3/uL (ref 0.0–0.5)
Eosinophils Relative: 0 %
HCT: 34.4 % — ABNORMAL LOW (ref 36.0–46.0)
Hemoglobin: 11.2 g/dL — ABNORMAL LOW (ref 12.0–15.0)
Immature Granulocytes: 1 %
Lymphocytes Relative: 11 %
Lymphs Abs: 1.2 10*3/uL (ref 0.7–4.0)
MCH: 29.4 pg (ref 26.0–34.0)
MCHC: 32.6 g/dL (ref 30.0–36.0)
MCV: 90.3 fL (ref 80.0–100.0)
Monocytes Absolute: 1 10*3/uL (ref 0.1–1.0)
Monocytes Relative: 9 %
Neutro Abs: 8.7 10*3/uL — ABNORMAL HIGH (ref 1.7–7.7)
Neutrophils Relative %: 79 %
Platelets: 312 10*3/uL (ref 150–400)
RBC: 3.81 MIL/uL — ABNORMAL LOW (ref 3.87–5.11)
RDW: 15.4 % (ref 11.5–15.5)
WBC: 11 10*3/uL — ABNORMAL HIGH (ref 4.0–10.5)
nRBC: 0 % (ref 0.0–0.2)

## 2019-02-28 LAB — COMPREHENSIVE METABOLIC PANEL
ALT: 23 U/L (ref 0–44)
AST: 24 U/L (ref 15–41)
Albumin: 3.7 g/dL (ref 3.5–5.0)
Alkaline Phosphatase: 47 U/L (ref 38–126)
Anion gap: 7 (ref 5–15)
BUN: 12 mg/dL (ref 6–20)
CO2: 26 mmol/L (ref 22–32)
Calcium: 8.6 mg/dL — ABNORMAL LOW (ref 8.9–10.3)
Chloride: 100 mmol/L (ref 98–111)
Creatinine, Ser: 0.58 mg/dL (ref 0.44–1.00)
GFR calc Af Amer: >60 mL/min (ref >60)
GFR calc non Af Amer: >60 mL/min (ref >60)
Glucose, Bld: 103 mg/dL — ABNORMAL HIGH (ref 70–99)
Potassium: 3.6 mmol/L (ref 3.5–5.1)
Sodium: 133 mmol/L — ABNORMAL LOW (ref 135–145)
Total Bilirubin: 0.4 mg/dL (ref 0.3–1.2)
Total Protein: 6.5 g/dL (ref 6.5–8.1)

## 2019-02-28 LAB — GLUCOSE, CAPILLARY: Glucose-Capillary: 98 mg/dL (ref 70–99)

## 2019-02-28 MED ORDER — MORPHINE SULFATE (PF) 4 MG/ML IV SOLN
4.0000 mg | Freq: Once | INTRAVENOUS | Status: AC
  Administered 2019-02-28: 11:00:00 4 mg via INTRAVENOUS
  Filled 2019-02-28: qty 1

## 2019-02-28 MED ORDER — OXYCODONE-ACETAMINOPHEN 5-325 MG PO TABS: 1.0000 | ORAL_TABLET | Freq: Three times a day (TID) | ORAL | 0 refills | Status: DC | PRN

## 2019-02-28 MED ORDER — ONDANSETRON HCL 4 MG/2ML IJ SOLN
4.0000 mg | Freq: Once | INTRAMUSCULAR | Status: AC
  Administered 2019-02-28: 11:00:00 4 mg via INTRAVENOUS
  Filled 2019-02-28: qty 2

## 2019-02-28 NOTE — ED Provider Notes (Signed)
[facility] Emergency Department Provider Note       Time seen: ----------------------------------------- 9:50 AM on 02/28/2019 -----------------------------------------   I have reviewed the triage vital signs and the nursing notes.  HISTORY   Chief Complaint Fall    HPI Patricia Cooper is a 48 y.o. female with a history of anemia, asthma, migraines, seizures who presents to the ED for a fall.  Patient reports she fell down stairs, she was going down some stairs that were covered and do.  She fell on her back and stated pain went down her right leg.  She received fentanyl in route.  Pain is 5 out of 10 in her back.  Earlier she had pain that radiated from her back down her right leg posteriorly.,  This appears to have improved.  Past Medical History:  Diagnosis Date  . Anemia   . ASCUS of cervix with negative high risk HPV 11/2018  . Asthma   . Epilepsy ([place])   . LGSIL of cervix of undetermined significance 07/2017  . Migraines   . Seizure disorder ([place])   . Sinusitis, chronic     Patient Active Problem List   Diagnosis Date Noted  . Epilepsy ([place])   . Asthma     Past Surgical History:  Procedure Laterality Date  . BREAST LUMPECTOMY    . NASAL SINUS SURGERY  03/2010,06-2013  . RADIOACTIVE SEED GUIDED EXCISIONAL BREAST BIOPSY Right 05/07/2018   Procedure: RADIOACTIVE SEED GUIDED EXCISIONAL BREAST BIOPSY;  Surgeon: Rolm Bookbinder, MD;  Location: [facility];  Service: General;  Laterality: Right;  . TUBAL LIGATION     POST PARTUM  . WISDOM TOOTH EXTRACTION      Allergies Shellfish allergy and Inderal [propranolol hcl]  Social History Social History   Tobacco Use  . Smoking status: Never Smoker  . Smokeless tobacco: Never Used  Substance Use Topics  . Alcohol use: No    Alcohol/week: 0.0 standard drinks  . Drug use: No   Review of Systems Constitutional: Negative for fever. Cardiovascular: Negative for chest  pain. Respiratory: Negative for shortness of breath. Gastrointestinal: Negative for abdominal pain, vomiting and diarrhea. Musculoskeletal: Positive for back pain Skin: Negative for rash. Neurological: Negative for headaches, focal weakness or numbness.  All systems negative/normal/unremarkable except as stated in the HPI  ____________________________________________   PHYSICAL EXAM:  VITAL SIGNS: ED Triage Vitals  Enc Vitals Group     BP 02/28/19 0942 121/83     Pulse Rate 02/28/19 0942 88     Resp 02/28/19 0942 14     Temp 02/28/19 0942 97.6 F (36.4 C)     Temp Source 02/28/19 0942 Oral     SpO2 02/28/19 0942 100 %     Weight 02/28/19 0945 135 lb (61.2 kg)     Height 02/28/19 0945 5\' 4"  (1.626 m)     Head Circumference --      Peak Flow --      Pain Score 02/28/19 0944 5     Pain Loc --      Pain Edu? --      Excl. in [place]? --    Constitutional: Alert and oriented.  Mild distress from pain Eyes: Conjunctivae are normal. Normal extraocular movements. Cardiovascular: Normal rate, regular rhythm. No murmurs, rubs, or gallops. Respiratory: Normal respiratory effort without tachypnea nor retractions. Breath sounds are clear and equal bilaterally. No wheezes/rales/rhonchi. Gastrointestinal: Soft and nontender. Normal bowel sounds Musculoskeletal: Nontender with normal range of motion in extremities.  No lower extremity tenderness nor edema.  Lower thoracic and upper lumbar midline tenderness Neurologic:  Normal speech and language. No gross focal neurologic deficits are appreciated.  Negative cross and straight leg raise examination.  No sensory or motor deficits are appreciated in the lower extremities Skin:  Skin is warm, dry and intact. No rash noted. Psychiatric: Mood and affect are normal. Speech and behavior are normal.  ____________________________________________  ED COURSE:  As part of my medical decision making, I reviewed the following data within the electronic  MEDICAL RECORD NUMBER History obtained from family if available, nursing notes, old chart and ekg, as well as notes from prior ED visits. Patient presented for a fall with low back pain, we will assess with labs and imaging as indicated at this time.   Procedures  [name] was evaluated in Emergency Department on 02/28/2019 for the symptoms described in the history of present illness. She was evaluated in the context of the global COVID-19 pandemic, which necessitated consideration that the patient might be at risk for infection with the SARS-CoV-2 virus that causes COVID-19. Institutional protocols and algorithms that pertain to the evaluation of patients at risk for COVID-19 are in a state of rapid change based on information released by regulatory bodies including the CDC and federal and state organizations. These policies and algorithms were followed during the patient's care in the ED.  ____________________________________________   LABS (pertinent positives/negatives)  Labs Reviewed  CBC WITH DIFFERENTIAL/PLATELET - Abnormal; Notable for the following components:      Result Value   WBC 11.0 (*)    RBC 3.81 (*)    Hemoglobin 11.2 (*)    HCT 34.4 (*)    Neutro Abs 8.7 (*)    All other components within normal limits  COMPREHENSIVE METABOLIC PANEL - Abnormal; Notable for the following components:   Sodium 133 (*)    Glucose, Bld 103 (*)    Calcium 8.6 (*)    All other components within normal limits    RADIOLOGY Images were viewed by me  Lumbar x-rays, thoracic spine x-rays IMPRESSION:  MR THORACIC SPINE IMPRESSION   1. Suspect subtle superior endplate fracture of T4 with surrounding  paraspinal hematoma.  2. Disc protrusions at T6-7 and T7-8.   MR LUMBAR SPINE IMPRESSION   1. Sacral fracture noted at the S3 level. CT pelvis may be helpful  to evaluate for any other pelvic fractures.  2. There is evidence of epidural hematoma related to the sacral  fracture  extending up to the L5 level.  3. No lumbar vertebral body fractures or disc protrusions.  ____________________________________________   DIFFERENTIAL DIAGNOSIS   Fall, contusion, fracture, sciatica  FINAL ASSESSMENT AND PLAN   Fall, T4 superior endplate fracture, disc protrusions, sacral fracture   Plan: The patient had presented for a fall down some stairs today. Patient's labs did not reveal any acute process. Patient's imaging did reveal some surprising fractures at T4 and S3.  These were reviewed as well as the hematoma by neurosurgery.  She was given express return precautions but otherwise is not felt to need any emergent treatment at this time.  She be discharged with pain medicine and they will follow-up with her in the neurosurgery clinic.   Ulice DashJohnathan E Ioan Landini, MD    Note: This note was generated in part or whole with voice recognition software. Voice recognition is usually quite accurate but there are transcription errors that can and very often do occur. I apologize for  any typographical errors that were not detected and corrected.     Emily Filbert, MD 02/28/19 [phone]

## 2019-02-28 NOTE — ED Notes (Signed)
Pt assisted to chair slowly, tolerated well.

## 2019-02-28 NOTE — ED Notes (Signed)
Pt states she is feeling better .

## 2019-02-28 NOTE — ED Notes (Signed)
Patient transported to CT 

## 2019-02-28 NOTE — ED Notes (Signed)
This RN went to discharge pt and pt needed to urinate. Pt assisted to toilet and then back to bed where she had c/o of feeling as if she was going to "pass out". Pt's husband stated that it was probably her "sugar". Pt's CBG 98. Pt given orange juice. Pt is now A&O and answering questions.

## 2019-02-28 NOTE — ED Triage Notes (Signed)
Pt arrives to ED via [facility] after a fall down stairs. Was going down some stairs that were covered with dew. Fell on back and stated pain went down R leg. Received 32mcg fentanyl in route. Arrives with 20G R AC.

## 2019-02-28 NOTE — ED Notes (Signed)
Pt verbalized understanding of discharge instructions. NAD at this time. 

## 2019-03-13 ENCOUNTER — Encounter: Payer: Self-pay | Admitting: Gynecology

## 2019-09-02 ENCOUNTER — Other Ambulatory Visit: Payer: Self-pay | Admitting: General Surgery

## 2019-09-02 DIAGNOSIS — Z1239 Encounter for other screening for malignant neoplasm of breast: Secondary | ICD-10-CM

## 2019-09-23 ENCOUNTER — Ambulatory Visit
Admission: RE | Admit: 2019-09-23 | Discharge: 2019-09-23 | Disposition: A | Discharge status: Home / Self Care | Payer: BC Managed Care – PPO | Source: Ambulatory Visit | Attending: General Surgery | Admitting: General Surgery

## 2019-09-23 ENCOUNTER — Other Ambulatory Visit: Payer: Self-pay

## 2019-09-23 DIAGNOSIS — Z1239 Encounter for other screening for malignant neoplasm of breast: Secondary | ICD-10-CM

## 2019-09-23 MED ORDER — GADOBUTROL 1 MMOL/ML IV SOLN
6.0000 mL | Freq: Once | INTRAVENOUS | Status: AC | PRN
  Administered 2019-09-23: 12:00:00 6 mL via INTRAVENOUS

## 2019-09-24 ENCOUNTER — Other Ambulatory Visit: Payer: Self-pay | Admitting: General Surgery

## 2019-09-24 DIAGNOSIS — R9389 Abnormal findings on diagnostic imaging of other specified body structures: Secondary | ICD-10-CM

## 2019-09-27 ENCOUNTER — Ambulatory Visit
Admission: RE | Admit: 2019-09-27 | Discharge: 2019-09-27 | Disposition: A | Discharge status: Home / Self Care | Payer: BC Managed Care – PPO | Source: Ambulatory Visit | Attending: General Surgery | Admitting: General Surgery

## 2019-09-27 ENCOUNTER — Other Ambulatory Visit: Payer: Self-pay

## 2019-09-27 DIAGNOSIS — R9389 Abnormal findings on diagnostic imaging of other specified body structures: Secondary | ICD-10-CM

## 2019-09-27 MED ORDER — GADOBUTROL 1 MMOL/ML IV SOLN
6.0000 mL | Freq: Once | INTRAVENOUS | Status: AC | PRN
  Administered 2019-09-27: 10:00:00 6 mL via INTRAVENOUS

## 2020-01-30 ENCOUNTER — Other Ambulatory Visit: Payer: Self-pay | Admitting: General Surgery

## 2020-01-30 DIAGNOSIS — Z1231 Encounter for screening mammogram for malignant neoplasm of breast: Secondary | ICD-10-CM

## 2020-02-28 ENCOUNTER — Other Ambulatory Visit: Payer: Self-pay

## 2020-02-28 ENCOUNTER — Ambulatory Visit
Admission: RE | Admit: 2020-02-28 | Discharge: 2020-02-28 | Disposition: A | Discharge status: Home / Self Care | Payer: BC Managed Care – PPO | Source: Ambulatory Visit | Attending: General Surgery | Admitting: General Surgery

## 2020-02-28 DIAGNOSIS — Z1231 Encounter for screening mammogram for malignant neoplasm of breast: Secondary | ICD-10-CM

## 2020-03-27 ENCOUNTER — Other Ambulatory Visit: Payer: Self-pay | Admitting: General Surgery

## 2020-03-27 DIAGNOSIS — Z1239 Encounter for other screening for malignant neoplasm of breast: Secondary | ICD-10-CM

## 2020-04-30 ENCOUNTER — Other Ambulatory Visit: Payer: Self-pay

## 2020-04-30 ENCOUNTER — Ambulatory Visit
Admission: RE | Admit: 2020-04-30 | Discharge: 2020-04-30 | Disposition: A | Discharge status: Home / Self Care | Payer: BC Managed Care – PPO | Source: Ambulatory Visit | Attending: General Surgery | Admitting: General Surgery

## 2020-04-30 DIAGNOSIS — Z1239 Encounter for other screening for malignant neoplasm of breast: Secondary | ICD-10-CM

## 2020-04-30 MED ORDER — GADOBUTROL 1 MMOL/ML IV SOLN
6.0000 mL | Freq: Once | INTRAVENOUS | Status: AC | PRN
  Administered 2020-04-30: 6 mL via INTRAVENOUS

## 2020-05-07 ENCOUNTER — Other Ambulatory Visit: Payer: Self-pay | Admitting: General Surgery

## 2020-05-07 DIAGNOSIS — R9389 Abnormal findings on diagnostic imaging of other specified body structures: Secondary | ICD-10-CM

## 2020-05-11 ENCOUNTER — Other Ambulatory Visit: Payer: Self-pay

## 2020-05-11 ENCOUNTER — Ambulatory Visit
Admission: RE | Admit: 2020-05-11 | Discharge: 2020-05-11 | Disposition: A | Discharge status: Home / Self Care | Payer: BC Managed Care – PPO | Source: Ambulatory Visit | Attending: General Surgery | Admitting: General Surgery

## 2020-05-11 ENCOUNTER — Other Ambulatory Visit: Payer: Self-pay | Admitting: General Surgery

## 2020-05-11 DIAGNOSIS — R9389 Abnormal findings on diagnostic imaging of other specified body structures: Secondary | ICD-10-CM

## 2020-05-11 MED ORDER — GADOBUTROL 1 MMOL/ML IV SOLN
6.0000 mL | Freq: Once | INTRAVENOUS | Status: AC | PRN
  Administered 2020-05-11: 6 mL via INTRAVENOUS

## 2020-06-28 ENCOUNTER — Other Ambulatory Visit: Payer: BC Managed Care – PPO

## 2020-06-29 ENCOUNTER — Other Ambulatory Visit: Payer: Self-pay | Admitting: General Surgery

## 2020-06-29 DIAGNOSIS — Z9189 Other specified personal risk factors, not elsewhere classified: Secondary | ICD-10-CM

## 2020-07-22 ENCOUNTER — Encounter (HOSPITAL_BASED_OUTPATIENT_CLINIC_OR_DEPARTMENT_OTHER): Payer: Self-pay | Admitting: General Surgery

## 2020-07-22 ENCOUNTER — Other Ambulatory Visit: Payer: Self-pay

## 2020-07-24 ENCOUNTER — Other Ambulatory Visit (HOSPITAL_COMMUNITY)
Admission: RE | Admit: 2020-07-24 | Discharge: 2020-07-24 | Disposition: A | Discharge status: Home / Self Care | Payer: BC Managed Care – PPO | Source: Ambulatory Visit | Attending: General Surgery | Admitting: General Surgery

## 2020-07-24 DIAGNOSIS — U071 COVID-19: Secondary | ICD-10-CM | POA: Insufficient documentation

## 2020-07-24 MED ORDER — ENSURE PRE-SURGERY PO LIQD: 296.0000 mL | Freq: Once | ORAL | Status: DC

## 2020-07-24 NOTE — Progress Notes (Signed)

## 2020-07-25 LAB — SARS CORONAVIRUS 2 (TAT 6-24 HRS): SARS Coronavirus 2: POSITIVE — AB

## 2020-07-25 NOTE — Progress Notes (Signed)
Caren Griffins from the on-call service of Dr. Darnelle Spangle with + covid results for this pt. The pt is to have surgery on Tues 2/8 at [facility] at 0900. The pt has not been notified as there will be pertinent questions the provider needs to answer.  These are the current guidelines:  Positive Results for:  Asymptomatic: Procedure postponed and quarantined for 10 days, unless the procedure is urgent. Symptomatic: Procedure postponed and quarantined for 14 days. Hospitalized with Covid: postponed and quarantined  for 21 days. Immunocompromised: Procedure postponed and quarantine for 20 days.  The pt will not be retested for 90 days from the + result.

## 2020-07-25 NOTE — Progress Notes (Signed)
Dr. Stetschulte called back and + covid results relayed. 

## 2020-07-26 ENCOUNTER — Telehealth: Payer: Self-pay | Admitting: Physician Assistant

## 2020-07-26 NOTE — Telephone Encounter (Signed)
Called to discuss with patient about Covid symptoms and the use of a monoclonal antibody infusion for those with mild to moderate Covid symptoms and at a high risk of hospitalization.   Pt is not qualified for the monoclonal antibody infusion or antiviral treatment as patient is asymptomatic. Preventative practices reviewed. Patient verbalized understanding.  [place], [place]  07/26/2020 12:23 PM

## 2020-07-27 ENCOUNTER — Other Ambulatory Visit: Payer: BC Managed Care – PPO

## 2020-07-27 DIAGNOSIS — Z20822 Contact with and (suspected) exposure to covid-19: Secondary | ICD-10-CM

## 2020-07-27 NOTE — Progress Notes (Signed)
Called Dr Doreen Salvage office and LM on VM that patient was covid + and she has seed placement scheduled for today that would need to be rescheduled. Informed we would drop the case to the depot for rescheduling.

## 2020-07-28 LAB — NOVEL CORONAVIRUS, NAA: SARS-CoV-2, NAA: NOT DETECTED

## 2020-07-28 LAB — SARS-COV-2, NAA 2 DAY TAT

## 2020-08-17 ENCOUNTER — Other Ambulatory Visit: Payer: Self-pay

## 2020-08-17 ENCOUNTER — Encounter (HOSPITAL_BASED_OUTPATIENT_CLINIC_OR_DEPARTMENT_OTHER): Payer: Self-pay | Admitting: General Surgery

## 2020-08-21 ENCOUNTER — Other Ambulatory Visit: Payer: Self-pay

## 2020-08-21 ENCOUNTER — Ambulatory Visit
Admission: RE | Admit: 2020-08-21 | Discharge: 2020-08-21 | Disposition: A | Discharge status: Home / Self Care | Payer: BC Managed Care – PPO | Source: Ambulatory Visit | Attending: General Surgery | Admitting: General Surgery

## 2020-08-21 DIAGNOSIS — Z9189 Other specified personal risk factors, not elsewhere classified: Secondary | ICD-10-CM

## 2020-08-24 ENCOUNTER — Ambulatory Visit (HOSPITAL_BASED_OUTPATIENT_CLINIC_OR_DEPARTMENT_OTHER): Payer: BC Managed Care – PPO | Admitting: Certified Registered"

## 2020-08-24 ENCOUNTER — Encounter (HOSPITAL_BASED_OUTPATIENT_CLINIC_OR_DEPARTMENT_OTHER)
Admission: RE | Disposition: A | Discharge status: Home / Self Care | Payer: Self-pay | Source: Home / Self Care | Attending: General Surgery

## 2020-08-24 ENCOUNTER — Ambulatory Visit
Admission: RE | Admit: 2020-08-24 | Discharge: 2020-08-24 | Disposition: A | Discharge status: Home / Self Care | Payer: BC Managed Care – PPO | Source: Ambulatory Visit | Attending: General Surgery | Admitting: General Surgery

## 2020-08-24 ENCOUNTER — Other Ambulatory Visit: Payer: Self-pay

## 2020-08-24 ENCOUNTER — Encounter (HOSPITAL_BASED_OUTPATIENT_CLINIC_OR_DEPARTMENT_OTHER): Payer: Self-pay | Admitting: General Surgery

## 2020-08-24 ENCOUNTER — Ambulatory Visit (HOSPITAL_BASED_OUTPATIENT_CLINIC_OR_DEPARTMENT_OTHER)
Admission: RE | Admit: 2020-08-24 | Discharge: 2020-08-24 | Disposition: A | Discharge status: Home / Self Care | Payer: BC Managed Care – PPO | Attending: General Surgery | Admitting: General Surgery

## 2020-08-24 DIAGNOSIS — Z9189 Other specified personal risk factors, not elsewhere classified: Secondary | ICD-10-CM

## 2020-08-24 DIAGNOSIS — Z91013 Allergy to seafood: Secondary | ICD-10-CM | POA: Insufficient documentation

## 2020-08-24 DIAGNOSIS — Z79899 Other long term (current) drug therapy: Secondary | ICD-10-CM | POA: Insufficient documentation

## 2020-08-24 DIAGNOSIS — N6489 Other specified disorders of breast: Secondary | ICD-10-CM | POA: Insufficient documentation

## 2020-08-24 DIAGNOSIS — Z7951 Long term (current) use of inhaled steroids: Secondary | ICD-10-CM | POA: Diagnosis not present

## 2020-08-24 DIAGNOSIS — Z888 Allergy status to other drugs, medicaments and biological substances status: Secondary | ICD-10-CM | POA: Diagnosis not present

## 2020-08-24 HISTORY — DX: Localization-related (focal) (partial) symptomatic epilepsy and epileptic syndromes with complex partial seizures, not intractable, without status epilepticus: G40.209

## 2020-08-24 HISTORY — PX: BREAST EXCISIONAL BIOPSY: SUR124

## 2020-08-24 HISTORY — DX: Gastro-esophageal reflux disease without esophagitis: K21.9

## 2020-08-24 HISTORY — DX: Allergy, unspecified, initial encounter: T78.40XA

## 2020-08-24 HISTORY — DX: Hypo-osmolality and hyponatremia: E87.1

## 2020-08-24 HISTORY — PX: RADIOACTIVE SEED GUIDED EXCISIONAL BREAST BIOPSY: SHX6490

## 2020-08-24 HISTORY — DX: Other complications of anesthesia, initial encounter: T88.59XA

## 2020-08-24 SURGERY — RADIOACTIVE SEED GUIDED BREAST BIOPSY
Anesthesia: General | Site: Breast | Laterality: Right

## 2020-08-24 MED ORDER — SCOPOLAMINE 1 MG/3DAYS TD PT72
MEDICATED_PATCH | TRANSDERMAL | Status: AC
  Filled 2020-08-24: qty 2

## 2020-08-24 MED ORDER — LIDOCAINE 2% (20 MG/ML) 5 ML SYRINGE
INTRAMUSCULAR | Status: DC | PRN
  Administered 2020-08-24: 60 mg via INTRAVENOUS

## 2020-08-24 MED ORDER — FENTANYL CITRATE (PF) 100 MCG/2ML IJ SOLN
INTRAMUSCULAR | Status: AC
  Filled 2020-08-24: qty 2

## 2020-08-24 MED ORDER — KETOROLAC TROMETHAMINE 15 MG/ML IJ SOLN
INTRAMUSCULAR | Status: AC
  Filled 2020-08-24: qty 1

## 2020-08-24 MED ORDER — TRAMADOL HCL 50 MG PO TABS: 50.0000 mg | ORAL_TABLET | Freq: Four times a day (QID) | ORAL | 0 refills | Status: AC | PRN

## 2020-08-24 MED ORDER — CEFAZOLIN SODIUM-DEXTROSE 2-4 GM/100ML-% IV SOLN
2.0000 g | INTRAVENOUS | Status: AC
  Administered 2020-08-24: 2 g via INTRAVENOUS

## 2020-08-24 MED ORDER — BUPIVACAINE HCL (PF) 0.25 % IJ SOLN
INTRAMUSCULAR | Status: DC | PRN
  Administered 2020-08-24: 10 mL

## 2020-08-24 MED ORDER — AMISULPRIDE (ANTIEMETIC) 5 MG/2ML IV SOLN: 10.0000 mg | Freq: Once | INTRAVENOUS | Status: DC | PRN

## 2020-08-24 MED ORDER — PROPOFOL 500 MG/50ML IV EMUL
INTRAVENOUS | Status: DC | PRN
  Administered 2020-08-24: 25 ug/kg/min via INTRAVENOUS

## 2020-08-24 MED ORDER — KETOROLAC TROMETHAMINE 15 MG/ML IJ SOLN
15.0000 mg | INTRAMUSCULAR | Status: AC
  Administered 2020-08-24: 15 mg via INTRAVENOUS

## 2020-08-24 MED ORDER — OXYCODONE HCL 5 MG/5ML PO SOLN
5.0000 mg | Freq: Once | ORAL | Status: DC | PRN
Start: 2020-08-24 — End: 2020-08-24

## 2020-08-24 MED ORDER — PROMETHAZINE HCL 25 MG/ML IJ SOLN: 6.2500 mg | INTRAMUSCULAR | Status: DC | PRN

## 2020-08-24 MED ORDER — FENTANYL CITRATE (PF) 100 MCG/2ML IJ SOLN: 25.0000 ug | INTRAMUSCULAR | Status: DC | PRN

## 2020-08-24 MED ORDER — LACTATED RINGERS IV SOLN: INTRAVENOUS | Status: DC

## 2020-08-24 MED ORDER — CEFAZOLIN SODIUM-DEXTROSE 2-4 GM/100ML-% IV SOLN
INTRAVENOUS | Status: AC
  Filled 2020-08-24: qty 100

## 2020-08-24 MED ORDER — MIDAZOLAM HCL 5 MG/5ML IJ SOLN
INTRAMUSCULAR | Status: DC | PRN
  Administered 2020-08-24: 2 mg via INTRAVENOUS

## 2020-08-24 MED ORDER — OXYCODONE HCL 5 MG PO TABS: 5.0000 mg | ORAL_TABLET | Freq: Once | ORAL | Status: DC | PRN

## 2020-08-24 MED ORDER — DEXAMETHASONE SODIUM PHOSPHATE 4 MG/ML IJ SOLN
INTRAMUSCULAR | Status: DC | PRN
  Administered 2020-08-24: 4 mg via INTRAVENOUS

## 2020-08-24 MED ORDER — ACETAMINOPHEN 500 MG PO TABS
1000.0000 mg | ORAL_TABLET | ORAL | Status: AC
  Administered 2020-08-24: 1000 mg via ORAL

## 2020-08-24 MED ORDER — PROPOFOL 10 MG/ML IV BOLUS
INTRAVENOUS | Status: DC | PRN
  Administered 2020-08-24: 150 mg via INTRAVENOUS

## 2020-08-24 MED ORDER — ONDANSETRON HCL 4 MG/2ML IJ SOLN
INTRAMUSCULAR | Status: DC | PRN
Start: 2020-08-24 — End: 2020-08-24
  Administered 2020-08-24: 4 mg via INTRAVENOUS

## 2020-08-24 MED ORDER — ACETAMINOPHEN 500 MG PO TABS
ORAL_TABLET | ORAL | Status: AC
  Filled 2020-08-24: qty 2

## 2020-08-24 MED ORDER — FENTANYL CITRATE (PF) 100 MCG/2ML IJ SOLN
INTRAMUSCULAR | Status: DC | PRN
  Administered 2020-08-24: 50 ug via INTRAVENOUS

## 2020-08-24 MED ORDER — MIDAZOLAM HCL 2 MG/2ML IJ SOLN
INTRAMUSCULAR | Status: AC
  Filled 2020-08-24: qty 2

## 2020-08-24 SURGICAL SUPPLY — 49 items
ADH SKN CLS APL DERMABOND .7 (GAUZE/BANDAGES/DRESSINGS) ×1
APL PRP STRL LF DISP 70% ISPRP (MISCELLANEOUS) ×1
BINDER BREAST LRG (GAUZE/BANDAGES/DRESSINGS) ×1 IMPLANT
BINDER BREAST MEDIUM (GAUZE/BANDAGES/DRESSINGS) IMPLANT
BLADE SURG 15 STRL LF DISP TIS (BLADE) ×1 IMPLANT
BLADE SURG 15 STRL SS (BLADE) ×2
CANISTER SUC SOCK COL 7IN (MISCELLANEOUS) IMPLANT
CANISTER SUCT 1200ML W/VALVE (MISCELLANEOUS) IMPLANT
CHLORAPREP W/TINT 26 (MISCELLANEOUS) ×2 IMPLANT
CLIP VESOCCLUDE SM WIDE 6/CT (CLIP) IMPLANT
COVER BACK TABLE 60X90IN (DRAPES) ×2 IMPLANT
COVER MAYO STAND STRL (DRAPES) ×2 IMPLANT
DECANTER SPIKE VIAL GLASS SM (MISCELLANEOUS) IMPLANT
DERMABOND ADVANCED (GAUZE/BANDAGES/DRESSINGS) ×1
DERMABOND ADVANCED .7 DNX12 (GAUZE/BANDAGES/DRESSINGS) ×1 IMPLANT
DRAPE GAMMA PROBE CRDLSS 10X38 (DRAPES) ×1 IMPLANT
DRAPE LAPAROSCOPIC ABDOMINAL (DRAPES) ×2 IMPLANT
DRAPE UTILITY XL STRL (DRAPES) ×2 IMPLANT
ELECT COATED BLADE 2.86 ST (ELECTRODE) ×2 IMPLANT
ELECT REM PT RETURN 9FT ADLT (ELECTROSURGICAL) ×2
ELECTRODE REM PT RTRN 9FT ADLT (ELECTROSURGICAL) ×1 IMPLANT
GLOVE BIOGEL M 6.5 STRL (GLOVE) ×1 IMPLANT
GLOVE SURG ENC MOIS LTX SZ7 (GLOVE) ×4 IMPLANT
GLOVE SURG UNDER POLY LF SZ7.5 (GLOVE) ×2 IMPLANT
GOWN STRL REUS W/ TWL LRG LVL3 (GOWN DISPOSABLE) ×2 IMPLANT
GOWN STRL REUS W/TWL LRG LVL3 (GOWN DISPOSABLE) ×4
HEMOSTAT ARISTA ABSORB 3G PWDR (HEMOSTASIS) IMPLANT
KIT MARKER MARGIN INK (KITS) ×2 IMPLANT
NDL HYPO 25X1 1.5 SAFETY (NEEDLE) ×1 IMPLANT
NEEDLE HYPO 25X1 1.5 SAFETY (NEEDLE) ×2 IMPLANT
NS IRRIG 1000ML POUR BTL (IV SOLUTION) IMPLANT
PACK BASIN DAY SURGERY FS (CUSTOM PROCEDURE TRAY) ×2 IMPLANT
PENCIL SMOKE EVACUATOR (MISCELLANEOUS) ×2 IMPLANT
RETRACTOR ONETRAX LX 90X20 (MISCELLANEOUS) IMPLANT
SLEEVE SCD COMPRESS KNEE MED (STOCKING) ×2 IMPLANT
SPONGE LAP 4X18 RFD (DISPOSABLE) ×2 IMPLANT
STRIP CLOSURE SKIN 1/2X4 (GAUZE/BANDAGES/DRESSINGS) ×2 IMPLANT
SUT MNCRL AB 4-0 PS2 18 (SUTURE) IMPLANT
SUT MON AB 5-0 PS2 18 (SUTURE) IMPLANT
SUT SILK 2 0 SH (SUTURE) IMPLANT
SUT VIC AB 2-0 SH 27 (SUTURE) ×2
SUT VIC AB 2-0 SH 27XBRD (SUTURE) ×1 IMPLANT
SUT VIC AB 3-0 SH 27 (SUTURE) ×2
SUT VIC AB 3-0 SH 27X BRD (SUTURE) ×1 IMPLANT
SYR CONTROL 10ML LL (SYRINGE) ×2 IMPLANT
TOWEL GREEN STERILE FF (TOWEL DISPOSABLE) ×2 IMPLANT
TRAY FAXITRON CT DISP (TRAY / TRAY PROCEDURE) ×2 IMPLANT
TUBE CONNECTING 20X1/4 (TUBING) IMPLANT
YANKAUER SUCT BULB TIP NO VENT (SUCTIONS) IMPLANT

## 2020-08-24 NOTE — Transfer of Care (Signed)
Immediate Anesthesia Transfer of Care Note  Patient: Patricia Cooper  Procedure(s) Performed: RIGHT RADIOACTIVE SEED GUIDED EXCISIONAL BREAST BIOPSY X 2 (Right Breast)  Patient Location: PACU  Anesthesia Type:General  Level of Consciousness: drowsy and patient cooperative  Airway & Oxygen Therapy: Patient Spontanous Breathing and Patient connected to face mask oxygen  Post-op Assessment: Report given to RN and Post -op Vital signs reviewed and stable  Post vital signs: Reviewed and stable  Last Vitals:  Vitals Value Taken Time  BP    Temp    Pulse    Resp 14 08/24/20 0918  SpO2    Vitals shown include unvalidated device data.  Last Pain:  Vitals:   08/24/20 0722  TempSrc: Oral  PainSc: 0-No pain      Patients Stated Pain Goal: 3 (08/24/20 2449)  Complications: No complications documented.

## 2020-08-24 NOTE — Op Note (Signed)
Preoperative diagnosis: right breast lesion times two on mm Postoperative diagnosis: saa Procedure Right breast seed guided excision times two Surgeon: Dr Harden Mo Anes: general Complications none Drains none EBL minimal Specimens:  1. Right breast superior lesion marked with paint 2. Two seeds separate 3. Right breast inferior lesion marked with paint Sponge and needle count correct dispo recovery stable.  Indications: 58 yof referred initially for right breast calcifications in 2019. she has no fh and no prior breast history. she has no mass or dc. she had mm that shows c density breasts. she had right breast calcifications present. this is 1mm in right uoq. stereo biopsy done and result was small focus of alh. she underwent excision that is alh. she is high risk by calculator. we decided to follow her with mri. her density is c. the left is negative. the right has a 6x6x6 mm irregular enhancing mass. this was biopsied and is benign and concordant. she had mm in September that showed c density breasts and this was negative. radiology recommended a six month mri that showed nl nodes, nl left breast, persistent lobular enhancing mass  lower outer quadrant of the right breast with a marking clip near this. recommended repeat biopsy to ensure adequate sampling. there is also a new 12 mm linear area of enhancement adjacent to prior surgical site. mri biopsy was scheduled. the nme near prior surgery was too superficial to be targeted. a clip could not be placed either. there is a fat density mass on mm that can be used for localization though. the loq mass was able to undergo clip placement but not a biopsy. she was recommended for excision of both areas. She had two seeds placed prior to beginning   Procedure: After informed consent obtained patient taken to the OR.  She was given antibiotics.  SCDs were in place. She was placed under general anesthesia without complication.   She was prepped and draped in standard sterile surgical fashion. Surgical timeout performed  I located both the seeds.  One of them was very near the skin. I infiltrated marcain and used the old periareolar skin. I dissected to the seeds. The superior one came out of a biopsy cavity with the dissection and both were close to the skin.  I removed this seed separate and then removed the area.  Mammogram was taken.  I then located the inferior seed It was basically in the skin. This seed came out while I was dissecting off the skin.  I sent the lesion separate.  I then obtained hemostasis. I closed the breast tissue with 2-0 vicryl. I closed the skin with 3-0 vicryl and 4-0 monocryl.  Glue and steristrips were used to close.  She tolerated well and was transferred to recovery stable.

## 2020-08-24 NOTE — Interval H&P Note (Signed)
History and Physical Interval Note:  08/24/2020 8:31 AM  Patricia Cooper  has presented today for surgery, with the diagnosis of RIGHT BREAST MASS X 2.  The various methods of treatment have been discussed with the patient and family. After consideration of risks, benefits and other options for treatment, the patient has consented to  Procedure(s): RIGHT RADIOACTIVE SEED GUIDED EXCISIONAL BREAST BIOPSY X 2 (Right) as a surgical intervention.  The patient's history has been reviewed, patient examined, no change in status, stable for surgery.  I have reviewed the patient's chart and labs.  Questions were answered to the patient's satisfaction.     Emelia Loron

## 2020-08-24 NOTE — H&P (Signed)
Patricia Cooper is an 50 y.o. female.   Chief Complaint: breast mass HPI: 54 yof referred initially for right breast calcifications in 2019. she has no fh and no prior breast history. she has no mass or dc. she had mm that shows c density breasts. she had right breast calcifications present. this is 37mm in right uoq. stereo biopsy done and result was small focus of alh. she underwent excision that is alh. she is high risk by calculator. we decided to follow her with mri. her density is c. the left is negative. the right has a 6x6x6 mm irregular enhancing mass. this was biopsied and is benign and concordant. she had mm in September that showed c density breasts and this was negative. radiology recommended a six month mri that showed nl nodes, nl left breast, persistent lobular enhancing mass  lower outer quadrant of the right breast with a marking clip near this. recommended repeat biopsy to ensure adequate sampling. there is also a new 12 mm linear area of enhancement adjacent to prior surgical site. mri biopsy was scheduled. the nme near prior surgery was too superficial to be targeted. a clip could not be placed either. there is a fat density mass on mm that can be used for localization though. the loq mass was able to undergo clip placement but not a biopsy. she was recommended for excision of both areas and Dr Marisa Sprinkles called me to discuss this.    Past Medical History:  Diagnosis Date  . Allergies   . Anemia   . ASCUS of cervix with negative high risk HPV 11/2018  . Asthma    allergy induced  . 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 Complication of anesthesia    takes a long time to wake up  . Epilepsy ([facility])   . GERD (gastroesophageal reflux disease)    uses as needed prilosec OTC  . LGSIL of cervix of undetermined significance 07/2017  . Low sodium syndrome   . Migraines   . Seizure disorder ([facility])   . Sinusitis, chronic     Past Surgical History:  Procedure  Laterality Date  . BREAST BIOPSY Right 09/27/2018  . BREAST EXCISIONAL BIOPSY Right 2019  . NASAL SINUS SURGERY  03/2010,06-2013  . RADIOACTIVE SEED GUIDED EXCISIONAL BREAST BIOPSY Right 05/07/2018   Procedure: RADIOACTIVE SEED GUIDED EXCISIONAL BREAST BIOPSY;  Surgeon: Emelia Loron, MD;  Location: [facility] OR;  Service: General;  Laterality: Right;  . TUBAL LIGATION     POST PARTUM  . WISDOM TOOTH EXTRACTION      Family History  Problem Relation Age of Onset  . Hypertension Father   . Rheum arthritis Maternal Grandmother   . Breast cancer Neg Hx    Social History:  reports that she has never smoked. She has never used smokeless tobacco. She reports that she does not drink alcohol and does not use drugs.  Allergies:  Allergies  Allergen Reactions  . Shellfish Allergy Swelling    SWELLING REACTION UNSPECIFIED   . Inderal [Propranolol Hcl] Rash    Medications Prior to Admission  Medication Sig Dispense Refill  . carbamazepine (TEGRETOL XR) 200 MG 12 hr tablet Take 400-600 mg by mouth See admin instructions. Take 400 mg by mouth in the morning and take 600 mg by mouth at bedtime    . cetirizine-pseudoephedrine (ZYRTEC-D) 5-120 MG tablet Take 1 tablet by mouth once a week.    . Fluticasone-Salmeterol (ADVAIR) 500-50 MCG/DOSE AEPB Inhale 1 puff into the  lungs 2 (two) times daily.   3  . levETIRAcetam (KEPPRA) 500 MG tablet Take 500 mg by mouth every 12 (twelve) hours.    . Multiple Vitamin (MULTIVITAMIN WITH MINERALS) TABS tablet Take 1 tablet by mouth daily.    . pregabalin (LYRICA) 100 MG capsule Take 100 mg by mouth at bedtime.     Marland Kitchen PRESCRIPTION MEDICATION See admin instructions. Allergy shots weekly on Monday    . sodium chloride 1 g tablet Take 1 g by mouth 2 (two) times daily with a meal.    . topiramate (TOPAMAX) 100 MG tablet Take 50 mg by mouth 2 (two) times daily.    Marland Kitchen albuterol (PROVENTIL HFA;VENTOLIN HFA) 108 (90 Base) MCG/ACT inhaler Inhale 2 puffs into the lungs every 6  (six) hours as needed for wheezing or shortness of breath.    . EPINEPHrine (EPI-PEN) 0.3 mg/0.3 mL DEVI Inject 0.3 mg into the muscle once.      No results found for this or any previous visit (from the past 48 hour(s)). No results found.  Review of Systems  All other systems reviewed and are negative.   Blood pressure 107/67, pulse 71, temperature 97.8 F (36.6 C), temperature source Oral, resp. rate 16, height 5\' 4"  (1.626 m), weight 55.7 kg, last menstrual period 06/22/2019, SpO2 100 %. Physical Exam  General Mental Status-Alert. Orientation-Oriented X3. Breast Nipples-No Discharge. Breast Lump-No Palpable Breast Mass. Lymphatic Head & Neck General Head & Neck Lymphatics: Bilateral - Description - Normal. Axillary General Axillary Region: Bilateral - Description - Normal. Note: no [place] adenopathy   Assessment/Plan BREAST MASS, RIGHT (N63.10) Story: Right breast mass excisional biopsy times two discussed option of following this in six months vs excision. with her risk level due to prior alh and age I think reasonable to consider excision. she is frustrated with this but understands. I discussed seed guided excisional biopsy of these two areas with risks, recovery. once we have these results we can discuss further care and imaging follow up. will schedule  08/20/2019, MD 08/24/2020, 8:28 AM

## 2020-08-24 NOTE — Anesthesia Preprocedure Evaluation (Signed)
Anesthesia Evaluation  Patient identified by MRN, date of birth, ID band Patient awake    Reviewed: Allergy & Precautions, H&P , NPO status , Patient's Chart, lab work & pertinent test results  Airway Mallampati: II  TM Distance: >3 FB Neck ROM: Full    Dental no notable dental hx.    Pulmonary asthma ,    Pulmonary exam normal breath sounds clear to auscultation       Cardiovascular negative cardio ROS Normal cardiovascular exam Rhythm:Regular Rate:Normal     Neuro/Psych  Headaches, Seizures -,  negative psych ROS   GI/Hepatic Neg liver ROS, GERD  ,  Endo/Other  negative endocrine ROS  Renal/GU negative Renal ROS  negative genitourinary   Musculoskeletal negative musculoskeletal ROS (+)   Abdominal   Peds negative pediatric ROS (+)  Hematology negative hematology ROS (+) anemia ,   Anesthesia Other Findings   Reproductive/Obstetrics negative OB ROS                             Anesthesia Physical Anesthesia Plan  ASA: II  Anesthesia Plan: General   Post-op Pain Management:    Induction: Intravenous  PONV Risk Score and Plan: 3 and Midazolam, Treatment may vary due to age or medical condition, Dexamethasone and Ondansetron  Airway Management Planned: LMA  Additional Equipment: None  Intra-op Plan:   Post-operative Plan: Extubation in OR  Informed Consent: I have reviewed the patients History and Physical, chart, labs and discussed the procedure including the risks, benefits and alternatives for the proposed anesthesia with the patient or authorized representative who has indicated his/her understanding and acceptance.     Dental advisory given  Plan Discussed with: CRNA and Anesthesiologist  Anesthesia Plan Comments:         Anesthesia Quick Evaluation

## 2020-08-24 NOTE — Discharge Instructions (Signed)
Next dose of Tylenol can be given after 1:30PM. Next dose of NSAID (Ibuprofen, Aleve, Motrin) can be given after 2:30PM.    Post Anesthesia Home Care Instructions  Activity: Get plenty of rest for the remainder of the day. A responsible individual must stay with you for 24 hours following the procedure.  For the next 24 hours, DO NOT: -Drive a car -Advertising copywriter -Drink alcoholic beverages -Take any medication unless instructed by your physician -Make any legal decisions or sign important papers.  Meals: Start with liquid foods such as gelatin or soup. Progress to regular foods as tolerated. Avoid greasy, spicy, heavy foods. If nausea and/or vomiting occur, drink only clear liquids until the nausea and/or vomiting subsides. Call your physician if vomiting continues.  Special Instructions/Symptoms: Your throat may feel dry or sore from the anesthesia or the breathing tube placed in your throat during surgery. If this causes discomfort, gargle with warm salt water. The discomfort should disappear within 24 hours.  If you had a scopolamine patch placed behind your ear for the management of post- operative nausea and/or vomiting:  1. The medication in the patch is effective for 72 hours, after which it should be removed.  Wrap patch in a tissue and discard in the trash. Wash hands thoroughly with soap and water. 2. You may remove the patch earlier than 72 hours if you experience unpleasant side effects which may include dry mouth, dizziness or visual disturbances. 3. Avoid touching the patch. Wash your hands with soap and water after contact with the patch.        [facility] Office Phone Number [phone]  BREAST BIOPSY/ PARTIAL MASTECTOMY: POST OP INSTRUCTIONS Take 400 mg of ibuprofen every 8 hours or 650 mg tylenol every 6 hours for next 72 hours then as needed. Use ice several times daily also. Always review your discharge instruction sheet given to you by  the facility where your surgery was performed.  IF YOU HAVE DISABILITY OR FAMILY LEAVE FORMS, YOU MUST BRING THEM TO THE OFFICE FOR PROCESSING.  DO NOT GIVE THEM TO YOUR DOCTOR.  1. A prescription for pain medication may be given to you upon discharge.  Take your pain medication as prescribed, if needed.  If narcotic pain medicine is not needed, then you may take acetaminophen (Tylenol), naprosyn (Alleve) or ibuprofen (Advil) as needed. 2. Take your usually prescribed medications unless otherwise directed 3. If you need a refill on your pain medication, please contact your pharmacy.  They will contact our office to request authorization.  Prescriptions will not be filled after 5pm or on week-ends. 4. You should eat very light the first 24 hours after surgery, such as soup, crackers, pudding, etc.  Resume your normal diet the day after surgery. 5. Most patients will experience some swelling and bruising in the breast.  Ice packs and a good support bra will help.  Wear the breast binder provided or a sports bra for 72 hours day and night.  After that wear a sports bra during the day until you return to the office. Swelling and bruising can take several days to resolve.  6. It is common to experience some constipation if taking pain medication after surgery.  Increasing fluid intake and taking a stool softener will usually help or prevent this problem from occurring.  A mild laxative (Milk of Magnesia or Miralax) should be taken according to package directions if there are no bowel movements after 48 hours. 7. Unless discharge instructions indicate  otherwise, you may remove your bandages 48 hours after surgery and you may shower at that time.  You may have steri-strips (small skin tapes) in place directly over the incision.  These strips should be left on the skin for 7-10 days and will come off on their own.  If your surgeon used skin glue on the incision, you may shower in 24 hours.  The glue will flake off  over the next 2-3 weeks.  Any sutures or staples will be removed at the office during your follow-up visit. 8. ACTIVITIES:  You may resume regular daily activities (gradually increasing) beginning the next day.  Wearing a good support bra or sports bra minimizes pain and swelling.  You may have sexual intercourse when it is comfortable. a. You may drive when you no longer are taking prescription pain medication, you can comfortably wear a seatbelt, and you can safely maneuver your car and apply brakes. b. RETURN TO WORK:  ______________________________________________________________________________________ 9. You should see your doctor in the office for a follow-up appointment approximately two weeks after your surgery.  Your doctor's nurse will typically make your follow-up appointment when she calls you with your pathology report.  Expect your pathology report 3-4 business days after your surgery.  You may call to check if you do not hear from [place] after three days. 10. OTHER INSTRUCTIONS: _______________________________________________________________________________________________ _____________________________________________________________________________________________________________________________________ _____________________________________________________________________________________________________________________________________ _____________________________________________________________________________________________________________________________________  WHEN TO CALL DR WAKEFIELD: 1. Fever over 101.0 2. Nausea and/or vomiting. 3. Extreme swelling or bruising. 4. Continued bleeding from incision. 5. Increased pain, redness, or drainage from the incision.  The clinic staff is available to answer your questions during regular business hours.  Please don't hesitate to call and ask to speak to one of the nurses for clinical concerns.  If you have a medical emergency, go to the  nearest emergency room or call 911.  A surgeon from [facility] is always on call at the hospital.  For further questions, please visit [email or web address]

## 2020-08-24 NOTE — Anesthesia Postprocedure Evaluation (Signed)
Anesthesia Post Note  Patient: Patricia Cooper  Procedure(s) Performed: RIGHT RADIOACTIVE SEED GUIDED EXCISIONAL BREAST BIOPSY X 2 (Right Breast)     Patient location during evaluation: PACU Anesthesia Type: General Level of consciousness: awake Pain management: pain level controlled Vital Signs Assessment: post-procedure vital signs reviewed and stable Respiratory status: spontaneous breathing and respiratory function stable Cardiovascular status: stable Postop Assessment: no apparent nausea or vomiting Anesthetic complications: no   No complications documented.  Last Vitals:  Vitals:   08/24/20 0955 08/24/20 1005  BP: 114/69 120/69  Pulse: 80 79  Resp: 12 16  Temp:  36.8 C  SpO2: 100% 100%    Last Pain:  Vitals:   08/24/20 1005  TempSrc:   PainSc: 0-No pain                 Mellody Dance

## 2020-08-24 NOTE — Anesthesia Procedure Notes (Signed)
Procedure Name: LMA Insertion Date/Time: 08/24/2020 8:41 AM Performed by: Sheryn Bison, CRNA Pre-anesthesia Checklist: Patient identified, Emergency Drugs available, Suction available and Patient being monitored Patient Re-evaluated:Patient Re-evaluated prior to induction Oxygen Delivery Method: Circle System Utilized Preoxygenation: Pre-oxygenation with 100% oxygen Induction Type: IV induction Ventilation: Mask ventilation without difficulty LMA: LMA inserted LMA Size: 4.0 Number of attempts: 1 Airway Equipment and Method: bite block Placement Confirmation: positive ETCO2 Tube secured with: Tape Dental Injury: Teeth and Oropharynx as per pre-operative assessment

## 2020-08-25 ENCOUNTER — Encounter (HOSPITAL_BASED_OUTPATIENT_CLINIC_OR_DEPARTMENT_OTHER): Payer: Self-pay | Admitting: General Surgery

## 2020-08-26 LAB — SURGICAL PATHOLOGY

## 2020-09-05 IMAGING — CT CT HEAD W/O CM
3 series · 15 of 46 positions shown, 18 images · non-contrast
Comparison: None.

CLINICAL DATA: Headache

EXAM:
CT HEAD WITHOUT CONTRAST
TECHNIQUE: Contiguous axial images were obtained from the base of the skull
through the vertex without intravenous contrast.

[Series 2: head wo · axial · 0.47mm/px · z∈[-115,+5]mm · 9 of 29 slices shown, 12 images]
[im 3/29  brain]
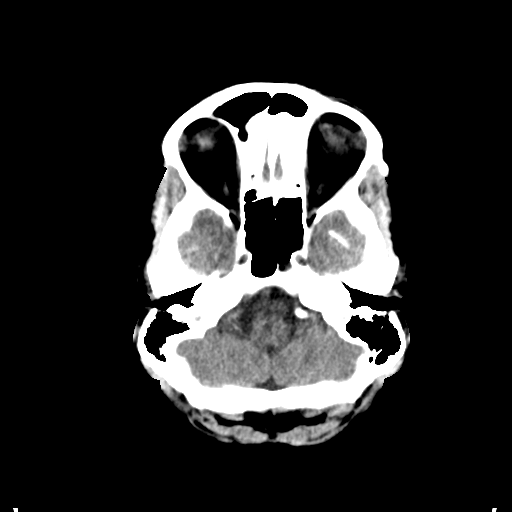
[im 3/29  bone]
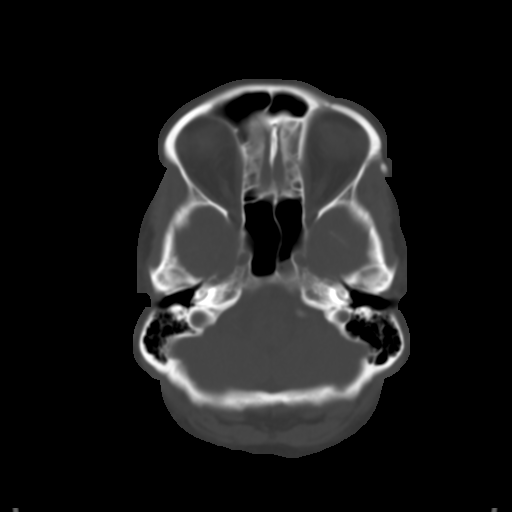
[im 6/29  brain]
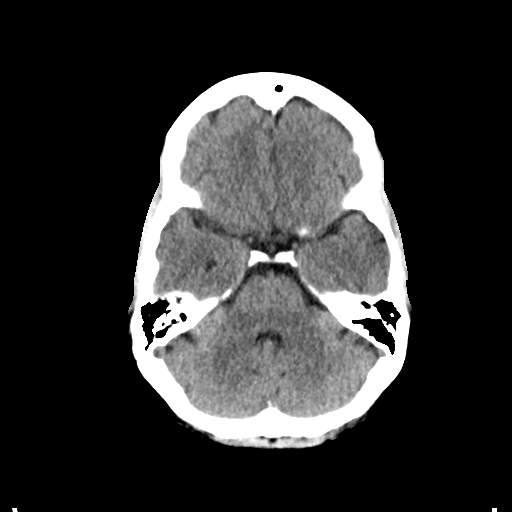
[im 9/29  brain]
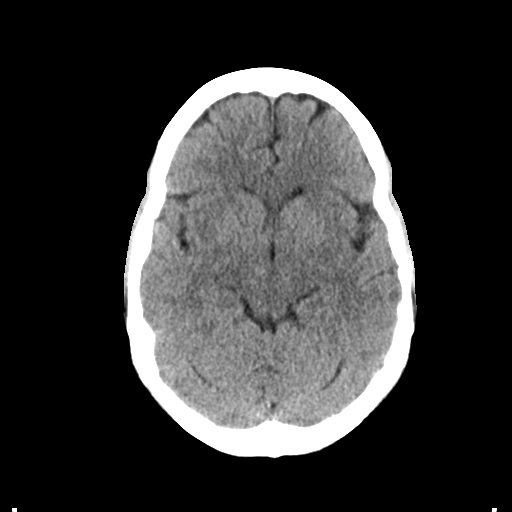
[im 12/29  brain]
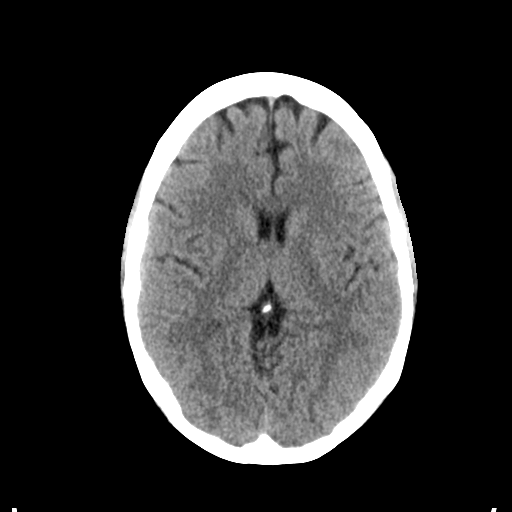
[im 15/29  brain]
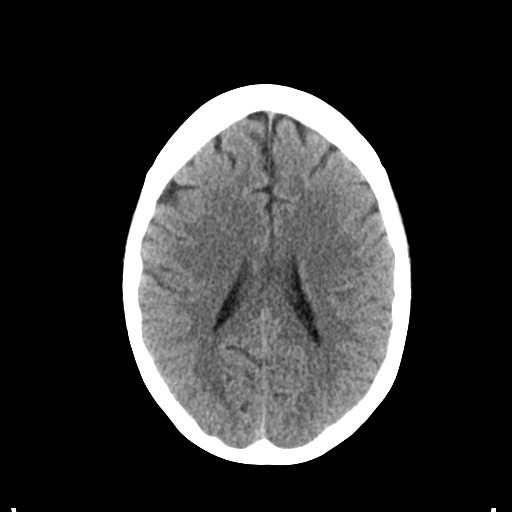
[im 15/29  bone]
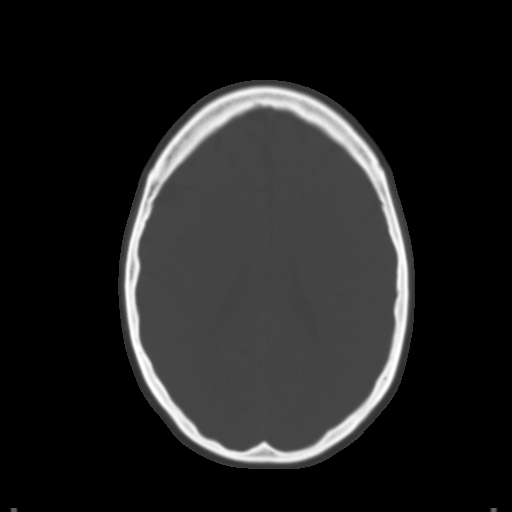
[im 18/29  brain]
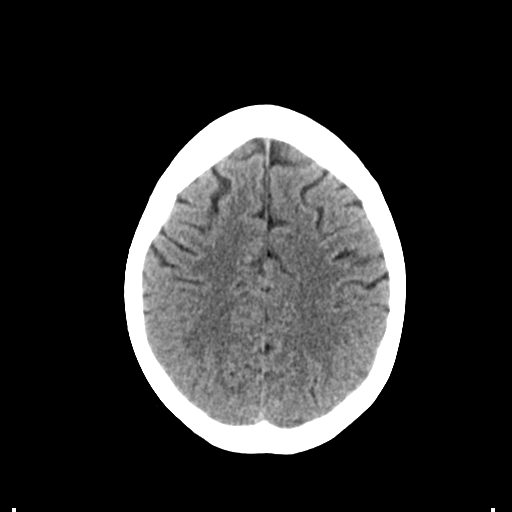
[im 21/29  brain]
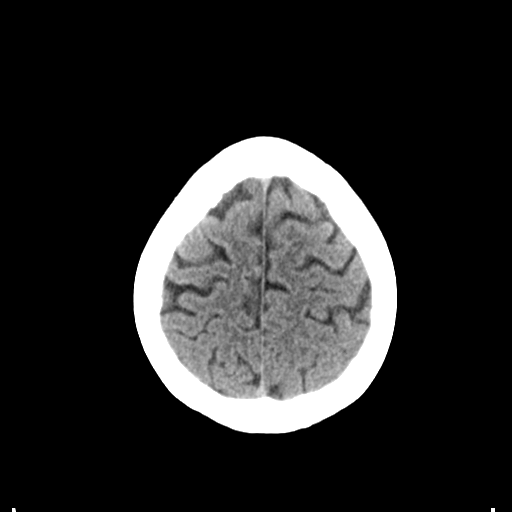
[im 24/29  brain]
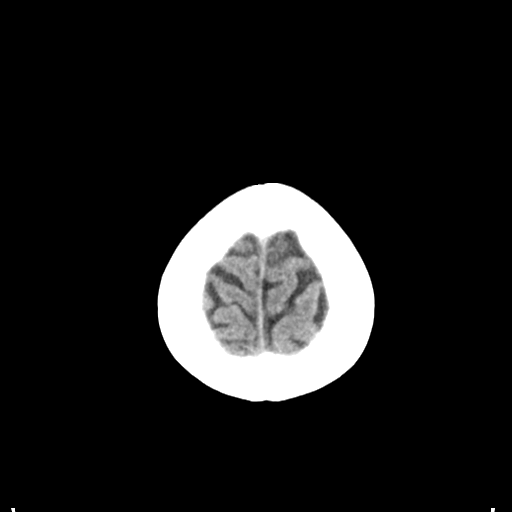
[im 27/29  brain]
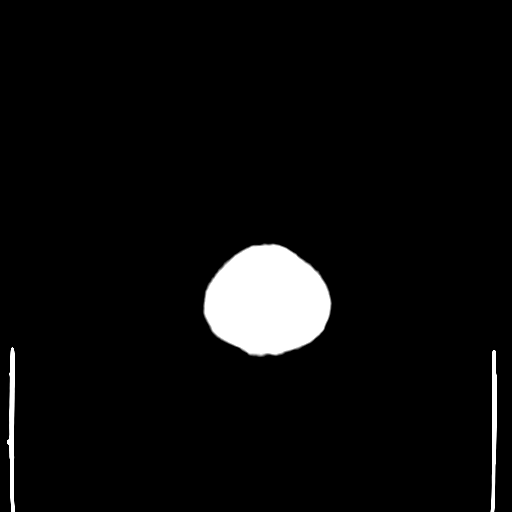
[im 27/29  bone]
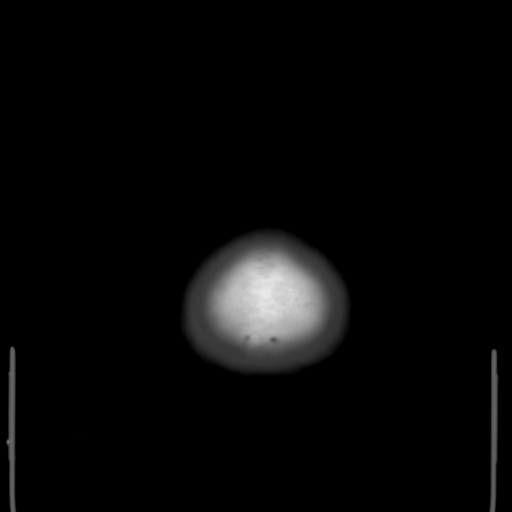

[Series 4: coronal soft tissue · coronal · 0.27mm/px · 3 of 65 slices shown]
[im 22/65  brain]
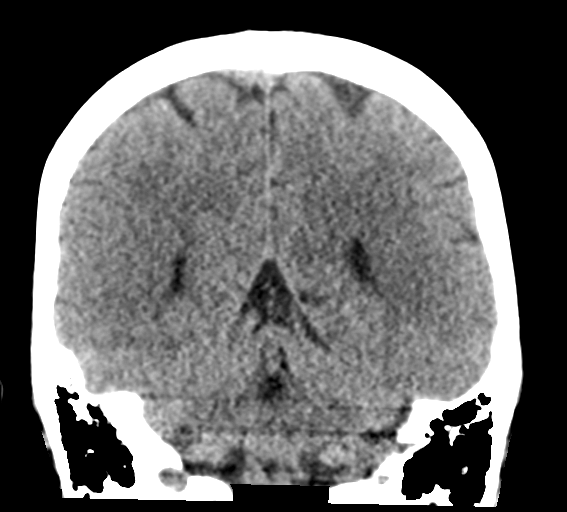
[im 29/65  brain]
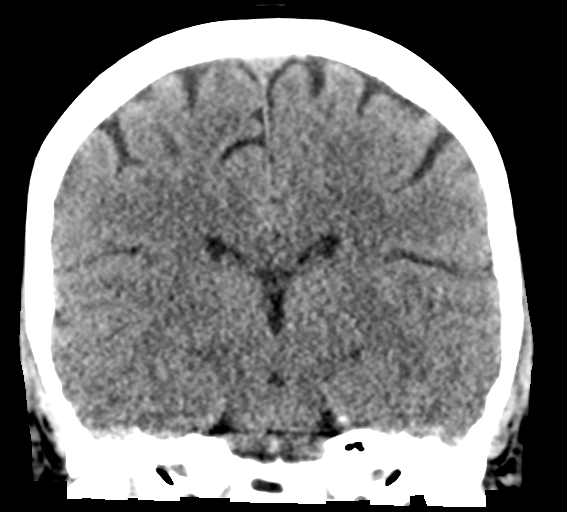
[im 36/65  brain]
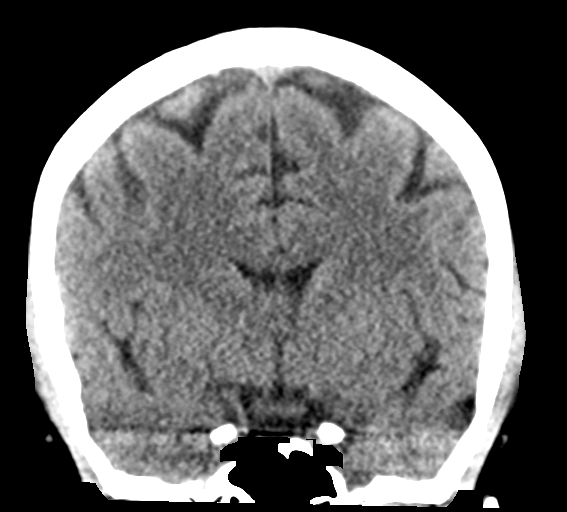

[Series 5: sagittal soft tissue · sagittal · 0.27mm/px · 3 of 52 slices shown]
[im 18/52  brain]
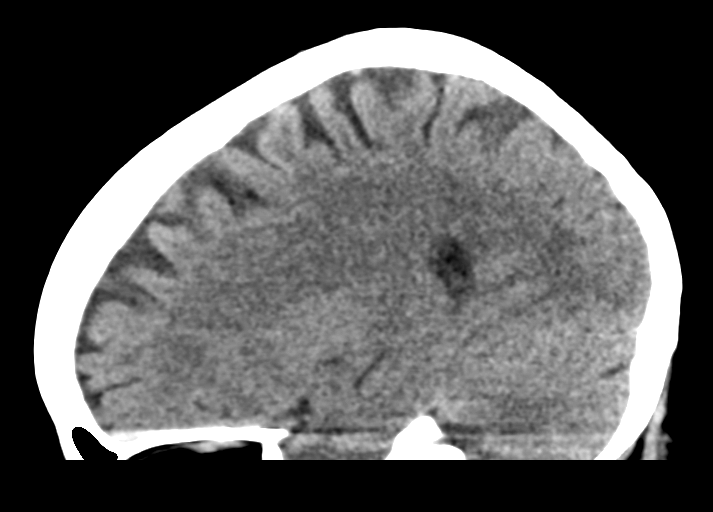
[im 26/52  brain]
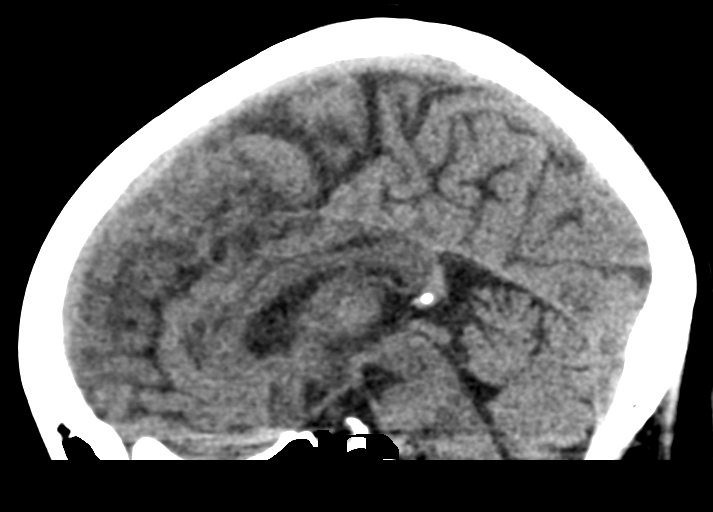
[im 35/52  brain]
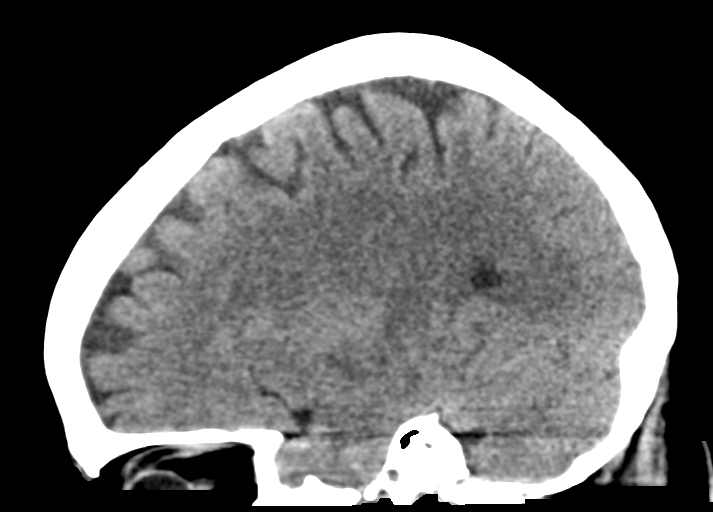

[15 of 46 positions shown; findings below may reference images not displayed]

FINDINGS: Brain: No acute intracranial abnormality. Specifically, no
hemorrhage, hydrocephalus, mass lesion, acute infarction, or
significant intracranial injury.

Vascular: No hyperdense vessel or unexpected calcification.

Skull: No acute calvarial abnormality.

Sinuses/Orbits: No acute findings

Other: None
IMPRESSION: No acute intracranial abnormality.

## 2021-04-02 ENCOUNTER — Other Ambulatory Visit: Payer: Self-pay | Admitting: General Surgery

## 2021-04-02 DIAGNOSIS — Z1231 Encounter for screening mammogram for malignant neoplasm of breast: Secondary | ICD-10-CM

## 2021-04-29 ENCOUNTER — Ambulatory Visit
Admission: RE | Admit: 2021-04-29 | Discharge: 2021-04-29 | Disposition: A | Discharge status: Home / Self Care | Payer: BC Managed Care – PPO | Source: Ambulatory Visit | Attending: General Surgery | Admitting: General Surgery

## 2021-04-29 DIAGNOSIS — Z1231 Encounter for screening mammogram for malignant neoplasm of breast: Secondary | ICD-10-CM

## 2021-06-08 ENCOUNTER — Other Ambulatory Visit: Payer: Self-pay

## 2021-06-08 ENCOUNTER — Other Ambulatory Visit (HOSPITAL_COMMUNITY)
Admission: RE | Admit: 2021-06-08 | Discharge: 2021-06-08 | Disposition: A | Discharge status: Home / Self Care | Payer: BC Managed Care – PPO | Source: Ambulatory Visit | Attending: Nurse Practitioner | Admitting: Nurse Practitioner

## 2021-06-08 ENCOUNTER — Encounter: Payer: Self-pay | Admitting: Nurse Practitioner

## 2021-06-08 ENCOUNTER — Ambulatory Visit (INDEPENDENT_AMBULATORY_CARE_PROVIDER_SITE_OTHER): Payer: BC Managed Care – PPO | Admitting: Nurse Practitioner

## 2021-06-08 VITALS — BP 130/72 | Ht 64.0 in | Wt 128.0 lb

## 2021-06-08 DIAGNOSIS — Z1211 Encounter for screening for malignant neoplasm of colon: Secondary | ICD-10-CM | POA: Diagnosis not present

## 2021-06-08 DIAGNOSIS — Z780 Asymptomatic menopausal state: Secondary | ICD-10-CM

## 2021-06-08 DIAGNOSIS — R61 Generalized hyperhidrosis: Secondary | ICD-10-CM

## 2021-06-08 DIAGNOSIS — Z01419 Encounter for gynecological examination (general) (routine) without abnormal findings: Secondary | ICD-10-CM | POA: Diagnosis not present

## 2021-06-08 NOTE — Patient Instructions (Signed)
Patricia Cooper

## 2021-06-08 NOTE — Progress Notes (Signed)
Patricia Cooper [facility] [birth year] [id number]   History:  50 y.o. G2P2002 presents for annual exam. Postmenopausal - no HRT. Has night sweats most nights. 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 2019/2020/2022 right benign breast biopsies and seed placement.   Gynecologic History Patient's last menstrual period was 06/22/2019.   Contraception/Family planning: post menopausal status and tubal ligation Sexually active: Yes  Health Maintenance Last Pap: 12/17/2018. Results were: ASCUS negative HPV Last mammogram: 04/29/2021. Results were: Normal Last colonoscopy: Never Last Dexa: Not indicated  Past medical history, past surgical history, family history and social history were all reviewed and documented in the EPIC chart. Married. 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 44 yo daughter, married in June 2022. 36 yo at [employer] for business.   ROS:  A ROS was performed and pertinent positives and negatives are included.  Exam:  Vitals:   06/08/21 1148  BP: 130/72  Weight: 128 lb (58.1 kg)  Height: 5\' 4"  (1.626 m)   Body mass index is 21.97 kg/m.  General appearance:  Normal Thyroid:  Symmetrical, normal in size, without palpable masses or nodularity. Respiratory  Auscultation:  Clear without wheezing or rhonchi Cardiovascular  Auscultation:  Regular rate, without rubs, murmurs or gallops  Edema/varicosities:  Not grossly evident Abdominal  Soft,nontender, without masses, guarding or rebound.  Liver/spleen:  No organomegaly noted  Hernia:  None appreciated  Skin  Inspection:  Grossly normal Breasts: Examined lying and sitting.   Right: Without masses, retractions, nipple discharge or axillary adenopathy.   Left: Without masses, retractions, nipple discharge or axillary adenopathy. Genitourinary   Inguinal/mons:  Normal without inguinal adenopathy  External  genitalia:  Normal appearing vulva with no masses, tenderness, or lesions  BUS/Urethra/Skene's glands:  Normal  Vagina:  Normal appearing with normal color and discharge, no lesions  Cervix:  Normal appearing without discharge or lesions  Uterus:  Normal in size, shape and contour.  Midline and mobile, nontender  Adnexa/parametria:     Rt: Normal in size, without masses or tenderness.   Lt: Normal in size, without masses or tenderness.  Anus and perineum: Normal  Digital rectal exam: Normal sphincter tone without palpated masses or tenderness  Patient informed chaperone available to be present for breast and pelvic exam. Patient has requested no chaperone to be present. Patient has been advised what will be completed during breast and pelvic exam.   Assessment/Plan:  50 y.o. 42 for annual exam.   Well female exam with routine gynecological exam - Plan: Cytology - PAP( [facility]). Education provided on SBEs, importance of preventative screenings, current guidelines, high calcium diet, regular exercise, and multivitamin daily.  Labs with PCP.  Postmenopausal - no HRT, no bleeding  Night sweats - Occur most nights since perimenopause. She does drink caffeine all day due to drowsiness that seizure medications cause. Recommend cutting back. Discussed lifestyle modifications plus OTC supplements she can try.   Screening for colon cancer - Plan: Cologuard. Discussed current guidelines and importance of preventative screenings. She would like to do Cologuard.   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 Pap with HR HPV today.   Screening for breast cancer - 2019/2020/2022 right benign breast biopsies and seed placement. Continue annual screenings.  Normal breast exam today.  Screening for osteoporosis - Average risk.  Will plan for DXA at age 63.   Return in 1 year for annual.     Patricia Cooper,  12:10 PM 06/08/2021

## 2021-06-10 LAB — CYTOLOGY - PAP
Comment: NEGATIVE
Diagnosis: NEGATIVE
High risk HPV: NEGATIVE

## 2021-06-24 LAB — COLOGUARD: COLOGUARD: NEGATIVE

## 2021-10-28 ENCOUNTER — Encounter (HOSPITAL_COMMUNITY): Payer: Self-pay

## 2022-04-13 ENCOUNTER — Other Ambulatory Visit: Payer: Self-pay | Admitting: General Surgery

## 2022-04-13 DIAGNOSIS — Z1231 Encounter for screening mammogram for malignant neoplasm of breast: Secondary | ICD-10-CM

## 2022-06-07 ENCOUNTER — Ambulatory Visit
Admission: RE | Admit: 2022-06-07 | Discharge: 2022-06-07 | Disposition: A | Discharge status: Home / Self Care | Payer: BC Managed Care – PPO | Source: Ambulatory Visit | Attending: General Surgery | Admitting: General Surgery

## 2022-06-07 DIAGNOSIS — Z1231 Encounter for screening mammogram for malignant neoplasm of breast: Secondary | ICD-10-CM

## 2023-07-31 ENCOUNTER — Other Ambulatory Visit: Payer: Self-pay | Admitting: General Surgery

## 2023-07-31 DIAGNOSIS — Z1231 Encounter for screening mammogram for malignant neoplasm of breast: Secondary | ICD-10-CM

## 2023-08-03 ENCOUNTER — Ambulatory Visit
Admission: RE | Admit: 2023-08-03 | Discharge: 2023-08-03 | Discharge status: Home / Self Care | Payer: Self-pay | Source: Ambulatory Visit | Attending: General Surgery | Admitting: General Surgery

## 2023-08-03 DIAGNOSIS — Z1231 Encounter for screening mammogram for malignant neoplasm of breast: Secondary | ICD-10-CM

## 2024-07-11 ENCOUNTER — Other Ambulatory Visit: Payer: Self-pay | Admitting: General Surgery

## 2024-07-11 DIAGNOSIS — Z1231 Encounter for screening mammogram for malignant neoplasm of breast: Secondary | ICD-10-CM

## 2024-08-06 ENCOUNTER — Ambulatory Visit
